# Patient Record
Sex: Male | Born: 1985
Health system: Southern US, Community
[De-identification: ages and names within clinical notes are randomized; demographics above are authoritative.]

## PROBLEM LIST (undated history)

## (undated) DIAGNOSIS — I10 Essential (primary) hypertension: Secondary | ICD-10-CM

## (undated) DIAGNOSIS — E119 Type 2 diabetes mellitus without complications: Secondary | ICD-10-CM

---

## 2011-12-06 ENCOUNTER — Encounter (HOSPITAL_BASED_OUTPATIENT_CLINIC_OR_DEPARTMENT_OTHER): Payer: Self-pay | Admitting: Family Medicine

## 2011-12-06 ENCOUNTER — Emergency Department (HOSPITAL_BASED_OUTPATIENT_CLINIC_OR_DEPARTMENT_OTHER)
Admission: EM | Admit: 2011-12-06 | Discharge: 2011-12-06 | Disposition: A | Discharge status: Home / Self Care | Payer: No Typology Code available for payment source | Attending: Emergency Medicine | Admitting: Emergency Medicine

## 2011-12-06 ENCOUNTER — Emergency Department (HOSPITAL_BASED_OUTPATIENT_CLINIC_OR_DEPARTMENT_OTHER): Payer: No Typology Code available for payment source

## 2011-12-06 DIAGNOSIS — R109 Unspecified abdominal pain: Secondary | ICD-10-CM

## 2011-12-06 DIAGNOSIS — I10 Essential (primary) hypertension: Secondary | ICD-10-CM | POA: Insufficient documentation

## 2011-12-06 DIAGNOSIS — S0993XA Unspecified injury of face, initial encounter: Secondary | ICD-10-CM | POA: Insufficient documentation

## 2011-12-06 DIAGNOSIS — Y93I9 Activity, other involving external motion: Secondary | ICD-10-CM | POA: Insufficient documentation

## 2011-12-06 DIAGNOSIS — Y92410 Unspecified street and highway as the place of occurrence of the external cause: Secondary | ICD-10-CM | POA: Insufficient documentation

## 2011-12-06 DIAGNOSIS — S199XXA Unspecified injury of neck, initial encounter: Secondary | ICD-10-CM | POA: Insufficient documentation

## 2011-12-06 DIAGNOSIS — R1084 Generalized abdominal pain: Secondary | ICD-10-CM | POA: Insufficient documentation

## 2011-12-06 DIAGNOSIS — M542 Cervicalgia: Secondary | ICD-10-CM

## 2011-12-06 HISTORY — DX: Essential (primary) hypertension: I10

## 2011-12-06 MED ORDER — HYDROCODONE-ACETAMINOPHEN 5-325 MG PO TABS: 2.0000 | ORAL_TABLET | ORAL | Status: DC | PRN

## 2011-12-06 MED ORDER — IOHEXOL 300 MG/ML  SOLN
100.0000 mL | Freq: Once | INTRAMUSCULAR | Status: AC | PRN
  Administered 2011-12-06: 100 mL via INTRAVENOUS

## 2011-12-06 NOTE — ED Provider Notes (Signed)
Medical screening examination/treatment/procedure(s) were performed by non-physician practitioner and as supervising physician I was immediately available for consultation/collaboration.   Carleene Cooper III, MD 12/06/11 [phone]
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
## 2016-02-02 ENCOUNTER — Emergency Department (HOSPITAL_BASED_OUTPATIENT_CLINIC_OR_DEPARTMENT_OTHER)
Admission: EM | Admit: 2016-02-02 | Discharge: 2016-02-02 | Disposition: A | Discharge status: Home / Self Care | Payer: Self-pay | Attending: Emergency Medicine | Admitting: Emergency Medicine

## 2016-02-02 ENCOUNTER — Encounter (HOSPITAL_BASED_OUTPATIENT_CLINIC_OR_DEPARTMENT_OTHER): Payer: Self-pay | Admitting: Emergency Medicine

## 2016-02-02 DIAGNOSIS — Z79899 Other long term (current) drug therapy: Secondary | ICD-10-CM | POA: Insufficient documentation

## 2016-02-02 DIAGNOSIS — E119 Type 2 diabetes mellitus without complications: Secondary | ICD-10-CM | POA: Insufficient documentation

## 2016-02-02 DIAGNOSIS — J111 Influenza due to unidentified influenza virus with other respiratory manifestations: Secondary | ICD-10-CM

## 2016-02-02 DIAGNOSIS — J0100 Acute maxillary sinusitis, unspecified: Secondary | ICD-10-CM | POA: Insufficient documentation

## 2016-02-02 DIAGNOSIS — R69 Illness, unspecified: Secondary | ICD-10-CM

## 2016-02-02 DIAGNOSIS — Z794 Long term (current) use of insulin: Secondary | ICD-10-CM | POA: Insufficient documentation

## 2016-02-02 DIAGNOSIS — I10 Essential (primary) hypertension: Secondary | ICD-10-CM | POA: Insufficient documentation

## 2016-02-02 HISTORY — DX: Type 2 diabetes mellitus without complications: E11.9

## 2016-02-02 MED ORDER — AMOXICILLIN 500 MG PO CAPS: 500.0000 mg | ORAL_CAPSULE | Freq: Three times a day (TID) | ORAL | 0 refills | Status: DC

## 2016-02-02 MED FILL — AMOXICILLIN 500 MG CAPSULE: 500 | 7 days supply | Qty: 21 | Fill #0

## 2016-02-02 NOTE — ED Triage Notes (Signed)
Pt states he is having facial pain, nasal congestion, cough and headache for over one week.   Unknown fever.  Coughing up yellow sputum.

## 2016-02-02 NOTE — ED Provider Notes (Addendum)
[facility]   CSN: [id number] Arrival date & time: 02/02/16  0715     History   Chief Complaint Chief Complaint  Patient presents with  . URI    HPI Curtis Rogers is a 31 y.o. male.  Patient c/o sinus congestion, drainage and pain in the past week.  He has tried otc meds without relief.  Patient states in past week also had sore throat, non prod cough, body aches, and subjective fever. No definite known ill contacts. Hx dm. No nausea or vomiting. No polyuria.     URI   Associated symptoms include congestion and sinus pain. Pertinent negatives include no chest pain, no diarrhea, no vomiting and no neck pain.    Past Medical History:  Diagnosis Date  . Diabetes mellitus without complication (HCC)   . Hypertension     There are no active problems to display for this patient.   No past surgical history on file.     Home Medications    Prior to Admission medications   Medication Sig Start Date End Date Taking? Authorizing Provider  insulin aspart protamine- aspart (NOVOLOG MIX 70/30) (70-30) 100 UNIT/ML injection Inject 93 Units into the skin 3 (three) times daily.   Yes Historical Provider, MD  lisinopril (PRINIVIL,ZESTRIL) 40 MG tablet Take 10 mg by mouth daily.    Yes Historical Provider, MD  simvastatin (ZOCOR) 40 MG tablet Take 40 mg by mouth daily.   Yes Historical Provider, MD    Family History No family history on file.  Social History Social History  Substance Use Topics  . Smoking status: Never Smoker  . Smokeless tobacco: Never Used  . Alcohol use No     Allergies   Patient has no known allergies.   Review of Systems Review of Systems  Constitutional: Positive for fever.  HENT: Positive for congestion, sinus pain and sinus pressure. Negative for trouble swallowing.   Respiratory: Negative for shortness of breath.   Cardiovascular: Negative for chest pain.  Gastrointestinal: Negative for diarrhea and vomiting.    Endocrine: Negative for polydipsia and polyuria.  Musculoskeletal: Negative for neck pain and neck stiffness.     Physical Exam Updated Vital Signs BP 159/95   Pulse 109   Temp 98.1 F (36.7 C) (Oral)   Resp 18   Ht 6\' 2"  (1.88 m)   Wt (!) 173.8 kg   SpO2 96%   BMI 49.20 kg/m   Physical Exam  Constitutional: He appears well-developed and well-nourished. No distress.  HENT:  Right Ear: External ear normal.  Left Ear: External ear normal.  Mouth/Throat: Oropharynx is clear and moist. No oropharyngeal exudate.  +nasal congestion, +sinus pain/tenderness.   Eyes: Conjunctivae are normal.  Neck: Neck supple. No tracheal deviation present.  No stiffness or rigidity  Cardiovascular: Normal rate, regular rhythm, normal heart sounds and intact distal pulses.  Exam reveals no gallop and no friction rub.   No murmur heard. Pulmonary/Chest: Effort normal and breath sounds normal. No accessory muscle usage. No respiratory distress.  Abdominal: Soft. He exhibits no distension. There is no tenderness.  Musculoskeletal: He exhibits no edema.  Neurological: He is alert.  Skin: Skin is warm and dry. No rash noted. He is not diaphoretic.  Psychiatric: He has a normal mood and affect.  Nursing note and vitals reviewed.    ED Treatments / Results  Labs (all labs ordered are listed, but only abnormal results are displayed) Labs Reviewed - No data to display  EKG  EKG Interpretation None       Radiology No results found.  Procedures Procedures (including critical care time)  Medications Ordered in ED Medications - No data to display   Initial Impression / Assessment and Plan / ED Course  I have reviewed the triage vital signs and the nursing notes.  Pertinent labs & imaging results that were available during my care of the patient were reviewed by me and considered in my medical decision making (see chart for details).  Recent ILI, now with sinus pain/pressure/drainage  not improved w otc meds.  Will rx sinusitis. Confirmed nkda w pt.     Final Clinical Impressions(s) / ED Diagnoses   Final diagnoses:  None    New Prescriptions New Prescriptions   No medications on file         Cathren Laine, MD 02/02/16 [id number]

## 2016-02-02 NOTE — Discharge Instructions (Signed)
It was our pleasure to provide your ER care today - we hope that you feel better.  Rest. Drink plenty of fluids.  Take tylenol and motrin as need for fever.  You may try an over the counter sinus medication as need for symptom relief.  Take amoxicillin (antibiotic) as prescribed.   Follow up with primary care doctor in 1 week if symptoms fail to improve/resolve.  Your blood pressure is high today - continue your medication, and follow up with your doctor in the next couple weeks.  Return to ER if worse, trouble breathing, other concern.

## 2016-12-16 ENCOUNTER — Other Ambulatory Visit: Payer: Self-pay

## 2016-12-16 ENCOUNTER — Emergency Department (HOSPITAL_BASED_OUTPATIENT_CLINIC_OR_DEPARTMENT_OTHER)
Admission: EM | Admit: 2016-12-16 | Discharge: 2016-12-16 | Disposition: A | Discharge status: Home / Self Care | Payer: Self-pay | Attending: Emergency Medicine | Admitting: Emergency Medicine

## 2016-12-16 ENCOUNTER — Encounter (HOSPITAL_BASED_OUTPATIENT_CLINIC_OR_DEPARTMENT_OTHER): Payer: Self-pay | Admitting: *Deleted

## 2016-12-16 DIAGNOSIS — H9209 Otalgia, unspecified ear: Secondary | ICD-10-CM | POA: Insufficient documentation

## 2016-12-16 DIAGNOSIS — I10 Essential (primary) hypertension: Secondary | ICD-10-CM | POA: Insufficient documentation

## 2016-12-16 DIAGNOSIS — E119 Type 2 diabetes mellitus without complications: Secondary | ICD-10-CM | POA: Insufficient documentation

## 2016-12-16 DIAGNOSIS — J019 Acute sinusitis, unspecified: Secondary | ICD-10-CM | POA: Insufficient documentation

## 2016-12-16 DIAGNOSIS — J069 Acute upper respiratory infection, unspecified: Secondary | ICD-10-CM | POA: Insufficient documentation

## 2016-12-16 DIAGNOSIS — R51 Headache: Secondary | ICD-10-CM | POA: Insufficient documentation

## 2016-12-16 DIAGNOSIS — Z794 Long term (current) use of insulin: Secondary | ICD-10-CM | POA: Insufficient documentation

## 2016-12-16 LAB — RAPID STREP SCREEN (MED CTR MEBANE ONLY): Streptococcus, Group A Screen (Direct): NEGATIVE

## 2016-12-16 MED ORDER — AZITHROMYCIN 250 MG PO TABS: 250.0000 mg | ORAL_TABLET | Freq: Every day | ORAL | 0 refills | Status: DC

## 2016-12-16 NOTE — ED Provider Notes (Signed)
[facility] EMERGENCY DEPARTMENT Provider Note   CSN: [id number] Arrival date & time: 12/16/16  1415     History   Chief Complaint Chief Complaint  Patient presents with  . Sore Throat    HPI Curtis Rogers is a 31 y.o. male.  The history is provided by the patient.  URI   This is a new problem. Episode onset: 4 days ago. The problem has been gradually worsening. There has been no fever. Associated symptoms include congestion, ear pain, headaches, plugged ear sensation, rhinorrhea, sinus pain, sore throat and cough. Pertinent negatives include no chest pain, no abdominal pain, no diarrhea, no nausea, no vomiting, no neck pain and no wheezing. Treatments tried: mucinex. The treatment provided no relief.    Past Medical History:  Diagnosis Date  . Diabetes mellitus without complication (HCC)   . Hypertension     There are no active problems to display for this patient.   History reviewed. No pertinent surgical history.     Home Medications    Prior to Admission medications   Medication Sig Start Date End Date Taking? Authorizing Provider  atorvastatin (LIPITOR) 40 MG tablet Take 40 mg by mouth daily.   Yes [provider]  lovastatin (ALTOPREV) 20 MG 24 hr tablet Take 20 mg by mouth at bedtime.   Yes [provider]  azithromycin (ZITHROMAX) 250 MG tablet Take 1 tablet (250 mg total) by mouth daily. Take first 2 tablets together, then 1 every day until finished.  Start in 2 days if still having symptoms 12/16/16   Gwyneth SproutPlunkett, Panayiotis [place], MD  insulin aspart protamine- aspart (NOVOLOG MIX 70/30) (70-30) 100 UNIT/ML injection Inject 93 Units into the skin 3 (three) times daily.    [provider]  lisinopril (PRINIVIL,ZESTRIL) 40 MG tablet Take 10 mg by mouth daily.     [provider]    Family History History reviewed. No pertinent family history.  Social History Social History   Tobacco Use  . Smoking status: Never  Smoker  . Smokeless tobacco: Never Used  Substance Use Topics  . Alcohol use: No  . Drug use: No     Allergies   Patient has no known allergies.   Review of Systems Review of Systems  HENT: Positive for congestion, ear pain, rhinorrhea, sinus pain and sore throat.   Respiratory: Positive for cough. Negative for wheezing.   Cardiovascular: Negative for chest pain.  Gastrointestinal: Negative for abdominal pain, diarrhea, nausea and vomiting.  Musculoskeletal: Negative for neck pain.  Neurological: Positive for headaches.  All other systems reviewed and are negative.    Physical Exam Updated Vital Signs BP (!) 163/94 (BP Location: Right Arm)   Pulse (!) 122   Temp 99 F (37.2 C) (Oral)   Resp 20   Ht 6\' 2"  (1.88 m)   Wt (!) 164.2 kg (362 lb)   SpO2 100%   BMI 46.48 kg/m   Physical Exam  Constitutional: He is oriented to person, place, and time. He appears well-developed and well-nourished. No distress.  HENT:  Head: Normocephalic and atraumatic.  Right Ear: Tympanic membrane is not erythematous, not retracted and not bulging. A middle ear effusion is present.  Left Ear: Tympanic membrane normal. Tympanic membrane is not erythematous, not retracted and not bulging.  Nose: Mucosal edema and rhinorrhea present. Right sinus exhibits maxillary sinus tenderness and frontal sinus tenderness. Left sinus exhibits maxillary sinus tenderness and frontal sinus tenderness.  Mouth/Throat: Mucous membranes are normal. No oral lesions.  Posterior oropharyngeal erythema present. No oropharyngeal exudate or posterior oropharyngeal edema.  Eyes: Conjunctivae and EOM are normal. Pupils are equal, round, and reactive to light.  Neck: Normal range of motion. Neck supple.  Cardiovascular: Normal rate, regular rhythm and intact distal pulses.  No murmur heard. Pulmonary/Chest: Effort normal and breath sounds normal. No respiratory distress. He has no wheezes. He has no rales.  Abdominal: Soft.  He exhibits no distension. There is no tenderness. There is no rebound and no guarding.  Musculoskeletal: Normal range of motion. He exhibits no edema or tenderness.  Neurological: He is alert and oriented to person, place, and time.  Skin: Skin is warm and dry. No rash noted. No erythema.  Psychiatric: He has a normal mood and affect. His behavior is normal.  Nursing note and vitals reviewed.    ED Treatments / Results  Labs (all labs ordered are listed, but only abnormal results are displayed) Labs Reviewed  RAPID STREP SCREEN (NOT AT [facility])  CULTURE, GROUP A STREP [facility])    EKG  EKG Interpretation None       Radiology No results found.  Procedures Procedures (including critical care time)  Medications Ordered in ED Medications - No data to display   Initial Impression / Assessment and Plan / ED Course  I have reviewed the triage vital signs and the nursing notes.  Pertinent labs & imaging results that were available during my care of the patient were reviewed by me and considered in my medical decision making (see chart for details).    Pt with symptoms consistent with viral URI and sinusitis.  Well appearing here.  No signs of breathing difficulty  No signs of pharyngitis, otitis or abnormal abdominal findings.   strep wnl and pt to return with any further problems.  Discussed holding antibiotics for several days but if symptoms persist then he can take them.   Final Clinical Impressions(s) / ED Diagnoses   Final diagnoses:  Viral upper respiratory tract infection  Acute non-recurrent sinusitis, unspecified location    ED Discharge Orders        Ordered    azithromycin (ZITHROMAX) 250 MG tablet  Daily     12/16/16 1518       Gwyneth SproutPlunkett, Areatha Kalata, MD 12/16/16 1524

## 2016-12-16 NOTE — ED Triage Notes (Signed)
Pt reports sore throat x 4 days. Unknown fever. Denies OTC meds PTA

## 2016-12-20 LAB — CULTURE, GROUP A STREP (THRC)

## 2017-02-23 ENCOUNTER — Encounter (HOSPITAL_BASED_OUTPATIENT_CLINIC_OR_DEPARTMENT_OTHER): Payer: Self-pay | Admitting: *Deleted

## 2017-02-23 ENCOUNTER — Emergency Department (HOSPITAL_BASED_OUTPATIENT_CLINIC_OR_DEPARTMENT_OTHER): Payer: Self-pay

## 2017-02-23 ENCOUNTER — Other Ambulatory Visit: Payer: Self-pay

## 2017-02-23 ENCOUNTER — Inpatient Hospital Stay (HOSPITAL_BASED_OUTPATIENT_CLINIC_OR_DEPARTMENT_OTHER)
Admission: EM | Admit: 2017-02-23 | Discharge: 2017-02-26 | DRG: 872 | Disposition: A | Discharge status: Home / Self Care | Payer: Self-pay | Attending: Internal Medicine | Admitting: Internal Medicine

## 2017-02-23 DIAGNOSIS — Z6841 Body Mass Index (BMI) 40.0 and over, adult: Secondary | ICD-10-CM

## 2017-02-23 DIAGNOSIS — E876 Hypokalemia: Secondary | ICD-10-CM | POA: Diagnosis present

## 2017-02-23 DIAGNOSIS — E872 Acidosis: Secondary | ICD-10-CM | POA: Diagnosis present

## 2017-02-23 DIAGNOSIS — I129 Hypertensive chronic kidney disease with stage 1 through stage 4 chronic kidney disease, or unspecified chronic kidney disease: Secondary | ICD-10-CM | POA: Diagnosis present

## 2017-02-23 DIAGNOSIS — E871 Hypo-osmolality and hyponatremia: Secondary | ICD-10-CM | POA: Diagnosis present

## 2017-02-23 DIAGNOSIS — A419 Sepsis, unspecified organism: Principal | ICD-10-CM | POA: Diagnosis present

## 2017-02-23 DIAGNOSIS — N182 Chronic kidney disease, stage 2 (mild): Secondary | ICD-10-CM | POA: Diagnosis present

## 2017-02-23 DIAGNOSIS — E7800 Pure hypercholesterolemia, unspecified: Secondary | ICD-10-CM | POA: Diagnosis present

## 2017-02-23 DIAGNOSIS — L03811 Cellulitis of head [any part, except face]: Secondary | ICD-10-CM | POA: Diagnosis present

## 2017-02-23 DIAGNOSIS — Z794 Long term (current) use of insulin: Secondary | ICD-10-CM

## 2017-02-23 DIAGNOSIS — L0291 Cutaneous abscess, unspecified: Secondary | ICD-10-CM

## 2017-02-23 DIAGNOSIS — E1122 Type 2 diabetes mellitus with diabetic chronic kidney disease: Secondary | ICD-10-CM | POA: Diagnosis present

## 2017-02-23 DIAGNOSIS — L02811 Cutaneous abscess of head [any part, except face]: Secondary | ICD-10-CM | POA: Diagnosis present

## 2017-02-23 DIAGNOSIS — I10 Essential (primary) hypertension: Secondary | ICD-10-CM | POA: Diagnosis present

## 2017-02-23 DIAGNOSIS — E118 Type 2 diabetes mellitus with unspecified complications: Secondary | ICD-10-CM | POA: Diagnosis present

## 2017-02-23 DIAGNOSIS — M6008 Infective myositis, other site: Secondary | ICD-10-CM | POA: Diagnosis present

## 2017-02-23 LAB — COMPREHENSIVE METABOLIC PANEL
ALT: 17 U/L (ref 17–63)
AST: 19 U/L (ref 15–41)
Albumin: 4.4 g/dL (ref 3.5–5.0)
Alkaline Phosphatase: 124 U/L (ref 38–126)
Anion gap: 13 (ref 5–15)
BUN: 13 mg/dL (ref 6–20)
CO2: 25 mmol/L (ref 22–32)
Calcium: 10.4 mg/dL — ABNORMAL HIGH (ref 8.9–10.3)
Chloride: 94 mmol/L — ABNORMAL LOW (ref 101–111)
Creatinine, Ser: 0.93 mg/dL (ref 0.61–1.24)
GFR calc Af Amer: >60 mL/min (ref >60)
GFR calc non Af Amer: >60 mL/min (ref >60)
Glucose, Bld: 355 mg/dL — ABNORMAL HIGH (ref 65–99)
Potassium: 3.4 mmol/L — ABNORMAL LOW (ref 3.5–5.1)
Sodium: 132 mmol/L — ABNORMAL LOW (ref 135–145)
Total Bilirubin: 1.2 mg/dL (ref 0.3–1.2)
Total Protein: 8.8 g/dL — ABNORMAL HIGH (ref 6.5–8.1)

## 2017-02-23 LAB — CBC WITH DIFFERENTIAL/PLATELET
Basophils Absolute: 0 10*3/uL (ref 0.0–0.1)
Basophils Relative: 0 %
Eosinophils Absolute: 0.1 10*3/uL (ref 0.0–0.7)
Eosinophils Relative: 1 %
HCT: 43.8 % (ref 39.0–52.0)
Hemoglobin: 15.8 g/dL (ref 13.0–17.0)
Lymphocytes Relative: 25 %
Lymphs Abs: 3 10*3/uL (ref 0.7–4.0)
MCH: 27.2 pg (ref 26.0–34.0)
MCHC: 36.1 g/dL — ABNORMAL HIGH (ref 30.0–36.0)
MCV: 75.5 fL — ABNORMAL LOW (ref 78.0–100.0)
Monocytes Absolute: 0.7 10*3/uL (ref 0.1–1.0)
Monocytes Relative: 6 %
Neutro Abs: 8.1 10*3/uL — ABNORMAL HIGH (ref 1.7–7.7)
Neutrophils Relative %: 68 %
Platelets: 297 10*3/uL (ref 150–400)
RBC: 5.8 MIL/uL (ref 4.22–5.81)
RDW: 12.5 % (ref 11.5–15.5)
WBC: 11.9 10*3/uL — ABNORMAL HIGH (ref 4.0–10.5)

## 2017-02-23 LAB — CBG MONITORING, ED: Glucose-Capillary: 215 mg/dL — ABNORMAL HIGH (ref 65–99)

## 2017-02-23 LAB — MAGNESIUM: Magnesium: 1.6 mg/dL — ABNORMAL LOW (ref 1.7–2.4)

## 2017-02-23 LAB — PROCALCITONIN: Procalcitonin: <0.1 ng/mL

## 2017-02-23 LAB — I-STAT CG4 LACTIC ACID, ED
Lactic Acid, Venous: 2.54 mmol/L (ref 0.5–1.9)
Lactic Acid, Venous: 1.94 mmol/L — ABNORMAL HIGH (ref 0.5–1.9)

## 2017-02-23 LAB — GLUCOSE, CAPILLARY: Glucose-Capillary: 228 mg/dL — ABNORMAL HIGH (ref 65–99)

## 2017-02-23 LAB — LACTIC ACID, PLASMA: Lactic Acid, Venous: 1.4 mmol/L (ref 0.5–1.9)

## 2017-02-23 MED ORDER — ACETAMINOPHEN 325 MG PO TABS: 650.0000 mg | ORAL_TABLET | Freq: Four times a day (QID) | ORAL | Status: DC | PRN

## 2017-02-23 MED ORDER — OXYCODONE HCL 5 MG PO TABS
5.0000 mg | ORAL_TABLET | ORAL | Status: DC | PRN
  Administered 2017-02-25: 5 mg via ORAL
  Filled 2017-02-23: qty 1

## 2017-02-23 MED ORDER — INSULIN ASPART PROT & ASPART (70-30 MIX) 100 UNIT/ML ~~LOC~~ SUSP
93.0000 [IU] | Freq: Three times a day (TID) | SUBCUTANEOUS | Status: DC
  Filled 2017-02-23: qty 10

## 2017-02-23 MED ORDER — VANCOMYCIN HCL 500 MG IV SOLR
INTRAVENOUS | Status: AC
  Filled 2017-02-23: qty 2000

## 2017-02-23 MED ORDER — PIPERACILLIN-TAZOBACTAM 3.375 G IVPB 30 MIN
3.3750 g | Freq: Once | INTRAVENOUS | Status: AC
  Administered 2017-02-23: 3.375 g via INTRAVENOUS
  Filled 2017-02-23 (×2): qty 50

## 2017-02-23 MED ORDER — LISINOPRIL 10 MG PO TABS: 10.0000 mg | ORAL_TABLET | Freq: Every day | ORAL | Status: DC

## 2017-02-23 MED ORDER — PIPERACILLIN-TAZOBACTAM 3.375 G IVPB
3.3750 g | Freq: Three times a day (TID) | INTRAVENOUS | Status: DC
  Administered 2017-02-23 – 2017-02-25 (×6): 3.375 g via INTRAVENOUS
  Filled 2017-02-23 (×5): qty 50

## 2017-02-23 MED ORDER — INSULIN ASPART 100 UNIT/ML ~~LOC~~ SOLN
0.0000 [IU] | Freq: Every day | SUBCUTANEOUS | Status: DC
  Administered 2017-02-23: 2 [IU] via SUBCUTANEOUS
  Administered 2017-02-24: 4 [IU] via SUBCUTANEOUS
  Administered 2017-02-25: 5 [IU] via SUBCUTANEOUS

## 2017-02-23 MED ORDER — ATORVASTATIN CALCIUM 40 MG PO TABS: 40.0000 mg | ORAL_TABLET | Freq: Every day | ORAL | Status: DC

## 2017-02-23 MED ORDER — SODIUM CHLORIDE 0.9 % IV BOLUS (SEPSIS)
1000.0000 mL | Freq: Once | INTRAVENOUS | Status: AC
  Administered 2017-02-23: 1000 mL via INTRAVENOUS

## 2017-02-23 MED ORDER — LIDOCAINE-EPINEPHRINE (PF) 2 %-1:200000 IJ SOLN
10.0000 mL | Freq: Once | INTRAMUSCULAR | Status: AC
  Administered 2017-02-23: 10 mL via INTRADERMAL
  Filled 2017-02-23: qty 10

## 2017-02-23 MED ORDER — ACETAMINOPHEN 650 MG RE SUPP: 650.0000 mg | Freq: Four times a day (QID) | RECTAL | Status: DC | PRN

## 2017-02-23 MED ORDER — ONDANSETRON HCL 4 MG PO TABS: 4.0000 mg | ORAL_TABLET | Freq: Four times a day (QID) | ORAL | Status: DC | PRN

## 2017-02-23 MED ORDER — INSULIN ASPART 100 UNIT/ML ~~LOC~~ SOLN
0.0000 [IU] | Freq: Three times a day (TID) | SUBCUTANEOUS | Status: DC
  Administered 2017-02-24 (×2): 8 [IU] via SUBCUTANEOUS
  Administered 2017-02-24: 3 [IU] via SUBCUTANEOUS
  Administered 2017-02-25 (×3): 5 [IU] via SUBCUTANEOUS
  Administered 2017-02-26: 11 [IU] via SUBCUTANEOUS

## 2017-02-23 MED ORDER — VANCOMYCIN HCL 10 G IV SOLR
1500.0000 mg | Freq: Three times a day (TID) | INTRAVENOUS | Status: DC
  Administered 2017-02-24 – 2017-02-26 (×8): 1500 mg via INTRAVENOUS
  Filled 2017-02-23 (×9): qty 1500

## 2017-02-23 MED ORDER — PRAVASTATIN SODIUM 20 MG PO TABS
20.0000 mg | ORAL_TABLET | Freq: Every day | ORAL | Status: DC
  Administered 2017-02-24 – 2017-02-25 (×2): 20 mg via ORAL
  Filled 2017-02-23 (×2): qty 1

## 2017-02-23 MED ORDER — VANCOMYCIN HCL 10 G IV SOLR
2000.0000 mg | Freq: Once | INTRAVENOUS | Status: AC
  Administered 2017-02-23: 2000 mg via INTRAVENOUS
  Filled 2017-02-23: qty 2000

## 2017-02-23 MED ORDER — ONDANSETRON HCL 4 MG/2ML IJ SOLN: 4.0000 mg | Freq: Four times a day (QID) | INTRAMUSCULAR | Status: DC | PRN

## 2017-02-23 MED ORDER — IOPAMIDOL (ISOVUE-300) INJECTION 61%
100.0000 mL | Freq: Once | INTRAVENOUS | Status: AC | PRN
  Administered 2017-02-23: 80 mL via INTRAVENOUS

## 2017-02-23 MED ORDER — POTASSIUM CHLORIDE CRYS ER 20 MEQ PO TBCR
20.0000 meq | EXTENDED_RELEASE_TABLET | Freq: Once | ORAL | Status: AC
  Administered 2017-02-23: 20 meq via ORAL
  Filled 2017-02-23: qty 1

## 2017-02-23 MED ORDER — AMLODIPINE BESYLATE 10 MG PO TABS
10.0000 mg | ORAL_TABLET | Freq: Every day | ORAL | Status: DC
  Administered 2017-02-24 – 2017-02-26 (×3): 10 mg via ORAL
  Filled 2017-02-23 (×3): qty 1

## 2017-02-23 MED ORDER — KETOROLAC TROMETHAMINE 30 MG/ML IJ SOLN
30.0000 mg | Freq: Four times a day (QID) | INTRAMUSCULAR | Status: DC | PRN
  Administered 2017-02-23 – 2017-02-26 (×10): 30 mg via INTRAVENOUS
  Filled 2017-02-23 (×10): qty 1

## 2017-02-23 MED ORDER — MORPHINE SULFATE (PF) 4 MG/ML IV SOLN
4.0000 mg | Freq: Once | INTRAVENOUS | Status: AC
  Administered 2017-02-23: 4 mg via INTRAVENOUS
  Filled 2017-02-23: qty 1

## 2017-02-23 MED ORDER — SODIUM CHLORIDE 0.9 % IV SOLN
INTRAVENOUS | Status: AC
  Administered 2017-02-23: 22:00:00 via INTRAVENOUS

## 2017-02-23 MED ORDER — INSULIN GLARGINE 100 UNIT/ML ~~LOC~~ SOLN
90.0000 [IU] | Freq: Every day | SUBCUTANEOUS | Status: DC
  Administered 2017-02-24 – 2017-02-25 (×3): 90 [IU] via SUBCUTANEOUS
  Filled 2017-02-23 (×3): qty 0.9

## 2017-02-23 MED ORDER — LISINOPRIL 20 MG PO TABS
40.0000 mg | ORAL_TABLET | Freq: Every day | ORAL | Status: DC
  Administered 2017-02-24 – 2017-02-26 (×3): 40 mg via ORAL
  Filled 2017-02-23 (×3): qty 2

## 2017-02-23 NOTE — ED Notes (Signed)
Report given to [facility], ETA 20min 

## 2017-02-23 NOTE — ED Notes (Signed)
Patient transported to CT 

## 2017-02-23 NOTE — ED Notes (Signed)
Report called to Immunologistachael RN at [facility]

## 2017-02-23 NOTE — Progress Notes (Addendum)
Pharmacy Antibiotic Note  Audie BoxSteveale Niess is a 32 y.o. male admitted on 02/23/2017 with scalp abscess.  Pharmacy has been consulted for vancomycin dosing. Renal function wnl.  Vancomycin trough goal 10-15  Plan: Start Vancomycin 2g IV x 1 then 1.5g IV q8 Start Zosyn 3.375 gm IV q8h (4 hour infusion) Follow renal function, culture, LOT, level if needed  Height: 6\' 2"  (188 cm) Weight: (!) 362 lb (164.2 kg) IBW/kg (Calculated) : 82.2  Temp (24hrs), Avg:98.1 F (36.7 C), Min:98.1 F (36.7 C), Max:98.1 F (36.7 C)  Recent Labs  Lab 02/23/17 1410 02/23/17 1427  WBC 11.9*  --   CREATININE 0.93  --   LATICACIDVEN  --  2.54*    Estimated Creatinine Clearance: 187.2 mL/min (by C-G formula based on SCr of 0.93 mg/dL).    No Known Allergies  Antimicrobials this admission: 2/22 Vancomycin >> 2/22 Zosyn >>  Dose adjustments this admission: n/a  Microbiology results: none  Thank you for allowing pharmacy to be a part of this patient's care.  Enzo BiNathan Maha Fischel, PharmD, BCPS Clinical Pharmacist Pager [phone] 02/23/2017 3:42 PM

## 2017-02-23 NOTE — ED Triage Notes (Signed)
Abscess to his scalp. Fever. Hx of diabetes.

## 2017-02-23 NOTE — ED Notes (Signed)
Ct will need to wait on labs for iv contrast, d/t hx of DM

## 2017-02-23 NOTE — ED Notes (Signed)
ED Provider at bedside for I&D for scalp

## 2017-02-23 NOTE — ED Provider Notes (Signed)
[facility] EMERGENCY DEPARTMENT Provider Note   CSN: [id number] Arrival date & time: 02/23/17  1320     History   Chief Complaint Chief Complaint  Patient presents with  . Abscess    HPI Curtis Rogers is a 32 y.o. male.  HPI   32 year old male with a history of diabetes, hypertension presents with concern for scalp abscess.  Reports there has been areas of folliculitis and infection or"hair bumps" for the last 3 weeks.  He had tried doing warm compresses and draining the area, however one area in the right side of the scalp has worsened, and after drainage, will appear to fill back up with pus an hour later.  Over the last 5 days, all of his symptoms has worsened, and the area to his posterior right scalp has become significantly swollen, severely painful, and he has had systemic symptoms develop.  Reports he has had diaphoresis, however is unsure if he has had fevers.  Denies chills.  Reports he has had nausea, but no vomiting.  Reports that he has had headaches as well that are severe, and located not just localized to area of scalp.  His glucose has been well-controlled at home.  Denies numbness, weakness, visual changes.  Reports he does occasionally have lightheadedness when he goes from lying down to sitting up.  Has hx of abscesses before but none this bad.   Past Medical History:  Diagnosis Date  . Diabetes mellitus without complication (HCC)   . Hypertension     There are no active problems to display for this patient.   History reviewed. No pertinent surgical history.     Home Medications    Prior to Admission medications   Medication Sig Start Date End Date Taking? Authorizing Provider  atorvastatin (LIPITOR) 40 MG tablet Take 40 mg by mouth daily.   Yes [provider]  insulin aspart protamine- aspart (NOVOLOG MIX 70/30) (70-30) 100 UNIT/ML injection Inject 93 Units into the skin 3 (three) times daily.   Yes [provider]    lisinopril (PRINIVIL,ZESTRIL) 40 MG tablet Take 10 mg by mouth daily.    Yes [provider]  lovastatin (ALTOPREV) 20 MG 24 hr tablet Take 20 mg by mouth at bedtime.   Yes [provider]  azithromycin (ZITHROMAX) 250 MG tablet Take 1 tablet (250 mg total) by mouth daily. Take first 2 tablets together, then 1 every day until finished.  Start in 2 days if still having symptoms 12/16/16   Gwyneth SproutPlunkett, Whitney, MD    Family History No family history on file.  Social History Social History   Tobacco Use  . Smoking status: Never Smoker  . Smokeless tobacco: Never Used  Substance Use Topics  . Alcohol use: No  . Drug use: No     Allergies   Patient has no known allergies.   Review of Systems Review of Systems  Constitutional: Positive for diaphoresis. Negative for fever.  HENT: Negative for sore throat.   Eyes: Negative for visual disturbance.  Respiratory: Negative for shortness of breath.   Cardiovascular: Negative for chest pain.  Gastrointestinal: Positive for nausea. Negative for abdominal pain and vomiting.  Genitourinary: Negative for difficulty urinating.  Musculoskeletal: Negative for back pain and neck stiffness.  Skin: Positive for rash and wound.  Neurological: Positive for light-headedness and headaches. Negative for syncope, facial asymmetry, weakness and numbness.     Physical Exam Updated Vital Signs BP (!) 158/89   Pulse (!) 112  Temp 98.1 F (36.7 C) (Oral)   Resp 20   Ht 6\' 2"  (1.88 m)   Wt (!) 164.2 kg (362 lb)   SpO2 100%   BMI 46.48 kg/m   Physical Exam  Constitutional: He is oriented to person, place, and time. He appears well-developed and well-nourished. No distress.  HENT:  Head: Normocephalic and atraumatic.  Eyes: Conjunctivae and EOM are normal.  Neck: Normal range of motion.  Cardiovascular: Regular rhythm, normal heart sounds and intact distal pulses. Tachycardia present. Exam reveals no gallop and no friction rub.   No murmur heard. Pulmonary/Chest: Effort normal and breath sounds normal. No respiratory distress. He has no wheezes. He has no rales.  Musculoskeletal: He exhibits no edema.  Neurological: He is alert and oriented to person, place, and time.  Skin: Skin is warm. He is diaphoretic.  Right posterior scalp with 1.5cm area of fluctuance, draining with pressure, surrounding scalp edema with tenderness through parietal scalp on right, posterior neck with folliculitis draining   Nursing note and vitals reviewed.    ED Treatments / Results  Labs (all labs ordered are listed, but only abnormal results are displayed) Labs Reviewed  CBC WITH DIFFERENTIAL/PLATELET - Abnormal; Notable for the following components:      Result Value   WBC 11.9 (*)    MCV 75.5 (*)    MCHC 36.1 (*)    Neutro Abs 8.1 (*)    All other components within normal limits  COMPREHENSIVE METABOLIC PANEL - Abnormal; Notable for the following components:   Sodium 132 (*)    Potassium 3.4 (*)    Chloride 94 (*)    Glucose, Bld 355 (*)    Calcium 10.4 (*)    Total Protein 8.8 (*)    All other components within normal limits  I-STAT CG4 LACTIC ACID, ED - Abnormal; Notable for the following components:   Lactic Acid, Venous 2.54 (*)    All other components within normal limits  I-STAT CG4 LACTIC ACID, ED - Abnormal; Notable for the following components:   Lactic Acid, Venous 1.94 (*)    All other components within normal limits  CULTURE, BLOOD (ROUTINE X 2)  CULTURE, BLOOD (ROUTINE X 2)    EKG  EKG Interpretation None       Radiology Ct Head W Or Wo Contrast  Result Date: 02/23/2017 CLINICAL DATA:  Headache.  Scalp abscess. EXAM: CT HEAD WITHOUT AND WITH CONTRAST TECHNIQUE: Contiguous axial images were obtained from the base of the skull through the vertex without and with intravenous contrast CONTRAST:  80mL ISOVUE-300 IOPAMIDOL (ISOVUE-300) INJECTION 61% COMPARISON:  None. FINDINGS: Brain: No mass lesion,  intraparenchymal hemorrhage or extra-axial collection. No evidence of acute cortical infarct. Brain parenchyma and CSF-containing spaces are normal for age. Vascular: No hyperdense vessel or unexpected calcification. Skull: Normal visualized skull base and calvarium. Sinuses/Orbits: No sinus fluid levels or advanced mucosal thickening. No mastoid effusion. Normal orbits. Other: Within the right posterior scalp soft tissues, there is extensive subcutaneous induration with edema of the underlying musculature. There is no fluid collection or abscess. There are 2 enlarged soft tissue nodules measuring approximately 1 cm each, likely enlarged lymph nodes. IMPRESSION: 1. Normal brain. 2. Right posterior scalp cellulitis and probable underlying myositis. No abscess or drainable fluid collection. Electronically Signed   By: Deatra Robinson M.D.   On: 02/23/2017 15:21    Procedures .Marland KitchenIncision and Drainage Date/Time: 02/23/2017 4:51 PM Performed by: Alvira Monday, MD Authorized by: Alvira Monday, MD  Consent:    Consent obtained:  Verbal   Consent given by:  Patient   Risks discussed:  Bleeding, incomplete drainage, infection, damage to other organs and pain   Alternatives discussed:  No treatment Location:    Type:  Abscess   Size:  1.5   Location:  Head   Head location:  Scalp Pre-procedure details:    Skin preparation:  Chloraprep Procedure details:    Incision types:  Stab incision   Incision depth:  Dermal   Scalpel blade:  11   Wound management:  Probed and deloculated   Drainage:  Purulent   Drainage amount:  Moderate   Wound treatment:  Wound left open   Packing materials:  None Post-procedure details:    Patient tolerance of procedure:  Tolerated well, no immediate complications  .Critical Care Performed by: Alvira Monday, MD Authorized by: Alvira Monday, MD   Critical care provider statement:    Critical care time (minutes):  30   Critical care was necessary to treat  or prevent imminent or life-threatening deterioration of the following conditions:  Sepsis   Critical care was time spent personally by me on the following activities:  Examination of patient, discussions with consultants, ordering and review of laboratory studies, ordering and review of radiographic studies and ordering and performing treatments and interventions   (including critical care time)  Medications Ordered in ED Medications  vancomycin (VANCOCIN) 2,000 mg in sodium chloride 0.9 % 500 mL IVPB (2,000 mg Intravenous New Bag/Given 02/23/17 1557)  vancomycin (VANCOCIN) 1,500 mg in sodium chloride 0.9 % 500 mL IVPB (not administered)  piperacillin-tazobactam (ZOSYN) IVPB 3.375 g (not administered)  vancomycin (VANCOCIN) 500 MG powder (not administered)  lidocaine-EPINEPHrine (XYLOCAINE W/EPI) 2 %-1:200000 (PF) injection 10 mL (10 mLs Intradermal Given 02/23/17 1358)  sodium chloride 0.9 % bolus 1,000 mL (0 mLs Intravenous Stopped 02/23/17 1456)  iopamidol (ISOVUE-300) 61 % injection 100 mL (80 mLs Intravenous Contrast Given 02/23/17 1500)  sodium chloride 0.9 % bolus 1,000 mL (1,000 mLs Intravenous New Bag/Given 02/23/17 1557)  morphine 4 MG/ML injection 4 mg (4 mg Intravenous Given 02/23/17 1557)  piperacillin-tazobactam (ZOSYN) IVPB 3.375 g (3.375 g Intravenous New Bag/Given 02/23/17 1614)     Initial Impression / Assessment and Plan / ED Course  I have reviewed the triage vital signs and the nursing notes.  Pertinent labs & imaging results that were available during my care of the patient were reviewed by me and considered in my medical decision making (see chart for details).     32 year old male with a history of diabetes, hypertension presents with concern for scalp abscess, diaphoresis, nausea, headaches.  Patient was signs of abscess with surrounding scalp edema and tenderness on exam, CT head was done with and without contrast to evaluate for signs of subgaleal abscess or other  deeper infection.  CT shows concern for posterior scalp cellulitis, and probable underlying myositis, with no sign of drainable fluid collection.  On my exam, patient does have an area which was drained at bedside with moderate-copious amount of purulence.  Patient with elevated lactic acid, tachycardia, WBC.  Given 2L of NS. Blood pressures normal. Labs show hyperglycemia without signs of DKA. Lactic acid improved however continuing tachycardia, ordered additional NS.  Given vancomycin and zosyn.  Will admit for IV antibiotics.  Initially called [facility], however no beds available, and patient accepted to [facility].  Made Dr. Lovell Sheehan of [facility] aware of patient, however agree given no sign of deep space abscess on  imaging at this time, plan will be to continue IV abx at [facility], if not improving or concern for clinical change or development of deeper abscess, pt would require transfer to [facility] for further evaluation.     Final Clinical Impressions(s) / ED Diagnoses   Final diagnoses:  Abscess  Cellulitis of scalp  Infective myositis of other site    ED Discharge Orders    None       Alvira Monday, MD 02/23/17 1725

## 2017-02-23 NOTE — H&P (Signed)
Curtis Rogers ZOX:[id number] DOB: 10-17-1985 DOA: 02/23/2017     PCP: [facility] Out[facility] Specialists: None Patient coming from:   home Lives   With family   Chief Complaint: Swelling of the scalp and fever  HPI: Curtis Rogers is a 32 y.o. male with medical history significant of DM2 HTN, hypercholesterolemia, obesity    Presented with 3 weeks of swelling concerning for folliculitis described the patient is "hair bumps" attempted to use warm compresses as well as drainage of the area but only worsened and filled up with pus.  For past 5 days he has had significant worsening in his symptoms with swelling extending to right posterior area of the scalp severely painful to touch.  Developed fevers and diaphoresis nausea but no vomiting.      While in ER: Originally presented to med [employer] sepsis criteria given tachycardia increased respirations and elevated WBC and lactic acid Blood cultures were obtained Abscess was opened and drained by ER provider Patient was started on vancomycin and Zosyn 2 L no saline administered  Significant initial  Findings:  WBC 11.9 Na132 K3.4 Cr 0.93 Potassium 3.4 Glucose 355  lactic acid 2.54>1.94 CT head posterior scalp cellulitis probable underlying myositis but no abscess at this point  ER provider discussed case with: Dr. Lovell Sheehan who recommends: Admission for IV antibiotics if no improvement patient will need to be transferred to [facility] for further evaluation   IN ER:  Temp (24hrs), Avg:98.1 F (36.7 C), Min:98 F (36.7 C), Max:98.2 F (36.8 C)      on arrival  ED Triage Vitals  Enc Vitals Group     BP 02/23/17 1325 (!) 150/109     Pulse Rate 02/23/17 1325 (!) 122     Resp 02/23/17 1325 18     Temp 02/23/17 1325 98.1 F (36.7 C)     Temp Source 02/23/17 1325 Oral     SpO2 02/23/17 1325 99 %     Weight 02/23/17 1324 (!) 362 lb (164.2 kg)     Height 02/23/17 1324 6\' 2"  (1.88 m)     Head  Circumference --      Peak Flow --      Pain Score 02/23/17 1324 10     Pain Loc --      Pain Edu? --      Excl. in GC? --     Latest temperature 98.2 RR 20 setting 100% HR 119 BP 168/105 Following Medications were ordered in ER: Medications  vancomycin (VANCOCIN) 1,500 mg in sodium chloride 0.9 % 500 mL IVPB (not administered)  piperacillin-tazobactam (ZOSYN) IVPB 3.375 g (not administered)  vancomycin (VANCOCIN) 500 MG powder (not administered)  insulin aspart protamine- aspart (NOVOLOG MIX 70/30) injection 93 Units (not administered)  lidocaine-EPINEPHrine (XYLOCAINE W/EPI) 2 %-1:200000 (PF) injection 10 mL (10 mLs Intradermal Given 02/23/17 1358)  sodium chloride 0.9 % bolus 1,000 mL (0 mLs Intravenous Stopped 02/23/17 1456)  iopamidol (ISOVUE-300) 61 % injection 100 mL (80 mLs Intravenous Contrast Given 02/23/17 1500)  vancomycin (VANCOCIN) 2,000 mg in sodium chloride 0.9 % 500 mL IVPB (0 mg Intravenous Stopped 02/23/17 1800)  sodium chloride 0.9 % bolus 1,000 mL (0 mLs Intravenous Stopped 02/23/17 1706)  morphine 4 MG/ML injection 4 mg (4 mg Intravenous Given 02/23/17 1557)  piperacillin-tazobactam (ZOSYN) IVPB 3.375 g (0 g Intravenous Stopped 02/23/17 1703)  sodium chloride 0.9 % bolus 1,000 mL (0 mLs Intravenous Stopped 02/23/17 1827)  morphine 4 MG/ML injection 4 mg (  4 mg Intravenous Given 02/23/17 1750)      Hospitalist was called for admission for scalp cellulitis and sepsis  Regarding pertinent Chronic problems: History of diabetes on insulin 70/30 reported good control blood sugars  history of hypertension on lisinopril   Review of Systems:    Pertinent positives include: Fevers,  headaches, nausea,   Constitutional:  No weight loss, night sweats, , fatigue, weight loss  HEENT:  NoDifficulty swallowing,Tooth/dental problems,Sore throat,  No sneezing, itching, ear ache, nasal congestion, post nasal drip,  Cardio-vascular:  No chest pain, Orthopnea, PND, anasarca,  dizziness, palpitations.no Bilateral lower extremity swelling  GI:  No heartburn, indigestion, abdominal pain, vomiting, diarrhea, change in bowel habits, loss of appetite, melena, blood in stool, hematemesis Resp:  no shortness of breath at rest. No dyspnea on exertion, No excess mucus, no productive cough, No non-productive cough, No coughing up of blood.No change in color of mucus.No wheezing. Skin:  no rash or lesions. No jaundice GU:  no dysuria, change in color of urine, no urgency or frequency. No straining to urinate.  No flank pain.  Musculoskeletal:  No joint pain or no joint swelling. No decreased range of motion. No back pain.  Psych:  No change in mood or affect. No depression or anxiety. No memory loss.  Neuro: no localizing neurological complaints, no tingling, no weakness, no double vision, no gait abnormality, no slurred speech, no confusion  As per HPI otherwise 10 point review of systems negative.   Past Medical History: Past Medical History:  Diagnosis Date  . Diabetes mellitus without complication (HCC)   . Hypertension    History reviewed. No pertinent surgical history.   Social History:  Ambulatory  Independently    reports that  has never smoked. he has never used smokeless tobacco. He reports that he does not drink alcohol or use drugs.  Allergies:  No Known Allergies     Family History:    Family History  Problem Relation Age of Onset  . Stroke Mother   . Diabetes Father   . Diabetes Other   . Breast cancer Other   . CAD Neg Hx     Medications: Prior to Admission medications   Medication Sig Start Date End Date Taking? Authorizing Provider  atorvastatin (LIPITOR) 40 MG tablet Take 40 mg by mouth daily.   Yes [provider]  insulin aspart protamine- aspart (NOVOLOG MIX 70/30) (70-30) 100 UNIT/ML injection Inject 93 Units into the skin 3 (three) times daily.   Yes [provider]  lisinopril (PRINIVIL,ZESTRIL) 40 MG  tablet Take 10 mg by mouth daily.    Yes [provider]  lovastatin (ALTOPREV) 20 MG 24 hr tablet Take 20 mg by mouth at bedtime.   Yes [provider]  azithromycin (ZITHROMAX) 250 MG tablet Take 1 tablet (250 mg total) by mouth daily. Take first 2 tablets together, then 1 every day until finished.  Start in 2 days if still having symptoms 12/16/16   Gwyneth Sprout, MD    Physical Exam: Patient Vitals for the past 24 hrs:  BP Temp Temp src Pulse Resp SpO2 Height Weight  02/23/17 2026 (!) 168/105 98.2 F (36.8 C) Oral (!) 119 20 100 % 6\' 2"  (1.88 m) (!) 178.3 kg (393 lb 1.3 oz)  02/23/17 1922 (!) 162/90 -- -- (!) 109 18 100 % -- --  02/23/17 1900 (!) 154/100 -- -- -- -- -- -- --  02/23/17 1830 (!) 154/98 -- -- -- -- -- -- --  02/23/17 1800 (!) 146/84 -- -- -- 16 99 % -- --  02/23/17 1738 (!) 156/95 -- -- -- -- -- -- --  02/23/17 1732 -- 98 F (36.7 C) Oral -- -- -- -- --  02/23/17 1700 137/68 -- -- (!) 107 (!) 25 97 % -- --  02/23/17 1640 (!) 158/89 -- -- (!) 112 20 100 % -- --  02/23/17 1615 -- -- -- (!) 108 15 99 % -- --  02/23/17 1600 (!) 155/108 -- -- (!) 124 (!) 21 99 % -- --  02/23/17 1530 -- -- -- (!) 106 (!) 23 96 % -- --  02/23/17 1514 (!) 154/99 -- -- -- -- -- -- --  02/23/17 1446 (!) 146/76 -- -- (!) 110 18 98 % -- --  02/23/17 1430 -- -- -- -- 17 -- -- --  02/23/17 1325 (!) 150/109 98.1 F (36.7 C) Oral (!) 122 18 99 % -- --  02/23/17 1324 -- -- -- -- -- -- 6\' 2"  (1.88 m) (!) 164.2 kg (362 lb)    1. General:  in No Acute distress  well  -appearing 2. Psychological: Alert and  Oriented 3. Head/ENT:    Dry Mucous Membranes                          Head Non traumatic, neck supple                          Normal Dentition 4. SKIN:  decreased Skin turgor,  Skin scalp induration noted fluctuance with expressable pus    5. Heart: Regular rate and rhythm no Murmur, no Rub or gallop 6. Lungs:  Clear to auscultation bilaterally, no wheezes or crackles    7. Abdomen: Soft, non-tender, Non distended   obese   8. Lower extremities: no clubbing, cyanosis, or edema 9. Neurologically Grossly intact, moving all 4 extremities equally   10. MSK: Normal range of motion   body mass index is 50.47 kg/m.  Labs on Admission:   Labs on Admission: I have personally reviewed following labs and imaging studies  CBC: Recent Labs  Lab 02/23/17 1410  WBC 11.9*  NEUTROABS 8.1*  HGB 15.8  HCT 43.8  MCV 75.5*  PLT 297   Basic Metabolic Panel: Recent Labs  Lab 02/23/17 1410  NA 132*  K 3.4*  CL 94*  CO2 25  GLUCOSE 355*  BUN 13  CREATININE 0.93  CALCIUM 10.4*   GFR: Estimated Creatinine Clearance: 196.3 mL/min (by C-G formula based on SCr of 0.93 mg/dL). Liver Function Tests: Recent Labs  Lab 02/23/17 1410  AST 19  ALT 17  ALKPHOS 124  BILITOT 1.2  PROT 8.8*  ALBUMIN 4.4   No results for input(s): LIPASE, AMYLASE in the last 168 hours. No results for input(s): AMMONIA in the last 168 hours. Coagulation Profile: No results for input(s): INR, PROTIME in the last 168 hours. Cardiac Enzymes: No results for input(s): CKTOTAL, CKMB, CKMBINDEX, TROPONINI in the last 168 hours. BNP (last 3 results) No results for input(s): PROBNP in the last 8760 hours. HbA1C: No results for input(s): HGBA1C in the last 72 hours. CBG: Recent Labs  Lab 02/23/17 1735  GLUCAP 215*   Lipid Profile: No results for input(s): CHOL, HDL, LDLCALC, TRIG, CHOLHDL, LDLDIRECT in the last 72 hours. Thyroid Function Tests: No results for input(s): TSH, T4TOTAL, FREET4, T3FREE, THYROIDAB in the last 72 hours. Anemia Panel:  No results for input(s): VITAMINB12, FOLATE, FERRITIN, TIBC, IRON, RETICCTPCT in the last 72 hours. Urine analysis: No results found for: COLORURINE, APPEARANCEUR, LABSPEC, PHURINE, GLUCOSEU, HGBUR, BILIRUBINUR, KETONESUR, PROTEINUR, UROBILINOGEN, NITRITE, LEUKOCYTESUR Sepsis Labs: @LABRCNTIP (procalcitonin:4,lacticidven:4) )No results  found for this or any previous visit (from the past 240 hour(s)).    UA not ordered  No results found for: HGBA1C  Estimated Creatinine Clearance: 196.3 mL/min (by C-G formula based on SCr of 0.93 mg/dL).  BNP (last 3 results) No results for input(s): PROBNP in the last 8760 hours.   ECG REPORT Not ordered  [facility] Weights   02/23/17 1324 02/23/17 2026  Weight: (!) 164.2 kg (362 lb) (!) 178.3 kg (393 lb 1.3 oz)     Cultures:    Component Value Date/Time   SDES THROAT 12/16/2016 1433   SPECREQUEST NONE Reflexed from [id number] 12/16/2016 1433   CULT  12/16/2016 1433    NO GROUP A STREP (S.PYOGENES) ISOLATED Performed at [facility], [address] [place], [place] [address]    REPTSTATUS 12/20/2016 FINAL 12/16/2016 1433     Radiological Exams on Admission: Ct Head W Or Wo Contrast  Result Date: 02/23/2017 CLINICAL DATA:  Headache.  Scalp abscess. EXAM: CT HEAD WITHOUT AND WITH CONTRAST TECHNIQUE: Contiguous axial images were obtained from the base of the skull through the vertex without and with intravenous contrast CONTRAST:  80mL ISOVUE-300 IOPAMIDOL (ISOVUE-300) INJECTION 61% COMPARISON:  None. FINDINGS: Brain: No mass lesion, intraparenchymal hemorrhage or extra-axial collection. No evidence of acute cortical infarct. Brain parenchyma and CSF-containing spaces are normal for age. Vascular: No hyperdense vessel or unexpected calcification. Skull: Normal visualized skull base and calvarium. Sinuses/Orbits: No sinus fluid levels or advanced mucosal thickening. No mastoid effusion. Normal orbits. Other: Within the right posterior scalp soft tissues, there is extensive subcutaneous induration with edema of the underlying musculature. There is no fluid collection or abscess. There are 2 enlarged soft tissue nodules measuring approximately 1 cm each, likely enlarged lymph nodes. IMPRESSION: 1. Normal brain. 2. Right posterior scalp cellulitis and probable underlying myositis. No  abscess or drainable fluid collection. Electronically Signed   By: Deatra Robinson M.D.   On: 02/23/2017 15:21    Chart has been reviewed    Assessment/Plan   32 y.o. male with medical history significant of DM2 HTN, hypercholesterolemia, obesity Admitted for cellulitis of the scalp resulting in sepsis  Present on Admission: . Cellulitis of scalp - -admit per cellulitis protocol will        continue current antibiotic choice vancomycin and Zosyn given patient      Imaging showed: no evidence of air  no evidence of osteomyelitis   No  foreign   objects       Will obtain MRSA screening,      blood cultures obtained in the ER     Check HIV status     further antibiotic adjustment pending above results  . Hypokalemia replaced and check magnesium level . Essential hypertension - continue lisinopril and norvasc . DM (diabetes mellitus), type 2 with complications (HCC)   - Order moderate  SSI   -   switch to   Lantus 90 units QHS, will need to resume home regimen at discharge  -  check TSH and HgA1C  - Hold by mouth medications     . Sepsis (HCC) -most likely secondary to cellulitis, continue broad-spectrum antibiotics administered IV fluids, lactic acid trending down, blood cultures pending   Other plan as per orders.  DVT prophylaxis:  SCD  Code Status:  FULL CODE  as per patient   Family Communication:   Family not  at  Bedside  plan of care was discussed with   Brother ,  mother  Disposition Plan:   To home once workup is complete and patient is stable                            Consults called: none    Admission status:  inpatient     Level of care  tele            I have spent a total of 56min on this admission  Racquel Arkin 02/23/2017, 10:19 PM    [facility]  Pager [phone]   after 2 AM please page floor coverage PA If 7AM-7PM, please contact the day team taking care of the patient  [email or web address]  Password [email or web address]

## 2017-02-24 DIAGNOSIS — Z6841 Body Mass Index (BMI) 40.0 and over, adult: Secondary | ICD-10-CM

## 2017-02-24 DIAGNOSIS — A419 Sepsis, unspecified organism: Principal | ICD-10-CM

## 2017-02-24 DIAGNOSIS — E876 Hypokalemia: Secondary | ICD-10-CM

## 2017-02-24 DIAGNOSIS — E119 Type 2 diabetes mellitus without complications: Secondary | ICD-10-CM

## 2017-02-24 DIAGNOSIS — L03811 Cellulitis of head [any part, except face]: Secondary | ICD-10-CM

## 2017-02-24 DIAGNOSIS — Z794 Long term (current) use of insulin: Secondary | ICD-10-CM

## 2017-02-24 LAB — COMPREHENSIVE METABOLIC PANEL
ALT: 14 U/L — ABNORMAL LOW (ref 17–63)
AST: 17 U/L (ref 15–41)
Albumin: 3.6 g/dL (ref 3.5–5.0)
Alkaline Phosphatase: 99 U/L (ref 38–126)
Anion gap: 12 (ref 5–15)
BUN: 12 mg/dL (ref 6–20)
CO2: 22 mmol/L (ref 22–32)
Calcium: 8.8 mg/dL — ABNORMAL LOW (ref 8.9–10.3)
Chloride: 100 mmol/L — ABNORMAL LOW (ref 101–111)
Creatinine, Ser: 1.03 mg/dL (ref 0.61–1.24)
GFR calc Af Amer: >60 mL/min (ref >60)
GFR calc non Af Amer: >60 mL/min (ref >60)
Glucose, Bld: 337 mg/dL — ABNORMAL HIGH (ref 65–99)
Potassium: 3.4 mmol/L — ABNORMAL LOW (ref 3.5–5.1)
Sodium: 134 mmol/L — ABNORMAL LOW (ref 135–145)
Total Bilirubin: 1.4 mg/dL — ABNORMAL HIGH (ref 0.3–1.2)
Total Protein: 7.2 g/dL (ref 6.5–8.1)

## 2017-02-24 LAB — CBC
HCT: 40.1 % (ref 39.0–52.0)
Hemoglobin: 13.9 g/dL (ref 13.0–17.0)
MCH: 27.7 pg (ref 26.0–34.0)
MCHC: 34.7 g/dL (ref 30.0–36.0)
MCV: 80 fL (ref 78.0–100.0)
Platelets: 263 10*3/uL (ref 150–400)
RBC: 5.01 MIL/uL (ref 4.22–5.81)
RDW: 12.5 % (ref 11.5–15.5)
WBC: 12.9 10*3/uL — ABNORMAL HIGH (ref 4.0–10.5)

## 2017-02-24 LAB — MRSA PCR SCREENING: MRSA by PCR: POSITIVE — AB

## 2017-02-24 LAB — HIV ANTIBODY (ROUTINE TESTING W REFLEX): HIV Screen 4th Generation wRfx: NONREACTIVE

## 2017-02-24 LAB — GLUCOSE, CAPILLARY
Glucose-Capillary: 269 mg/dL — ABNORMAL HIGH (ref 65–99)
Glucose-Capillary: 188 mg/dL — ABNORMAL HIGH (ref 65–99)
Glucose-Capillary: 281 mg/dL — ABNORMAL HIGH (ref 65–99)
Glucose-Capillary: 306 mg/dL — ABNORMAL HIGH (ref 65–99)

## 2017-02-24 LAB — CREATININE, URINE, RANDOM: Creatinine, Urine: 128.06 mg/dL

## 2017-02-24 LAB — TSH: TSH: 1.427 u[IU]/mL (ref 0.350–4.500)

## 2017-02-24 LAB — LACTIC ACID, PLASMA: Lactic Acid, Venous: 1.7 mmol/L (ref 0.5–1.9)

## 2017-02-24 LAB — PHOSPHORUS: Phosphorus: 3.4 mg/dL (ref 2.5–4.6)

## 2017-02-24 LAB — MAGNESIUM: Magnesium: 1.6 mg/dL — ABNORMAL LOW (ref 1.7–2.4)

## 2017-02-24 LAB — SODIUM, URINE, RANDOM: Sodium, Ur: 96 mmol/L

## 2017-02-24 MED ORDER — CHLORHEXIDINE GLUCONATE CLOTH 2 % EX PADS
6.0000 | MEDICATED_PAD | Freq: Every day | CUTANEOUS | Status: DC
  Administered 2017-02-25 – 2017-02-26 (×2): 6 via TOPICAL

## 2017-02-24 MED ORDER — POTASSIUM CHLORIDE CRYS ER 20 MEQ PO TBCR
40.0000 meq | EXTENDED_RELEASE_TABLET | Freq: Once | ORAL | Status: AC
Start: 2017-02-24 — End: 2017-02-24
  Administered 2017-02-24: 40 meq via ORAL

## 2017-02-24 MED ORDER — MAGNESIUM SULFATE 2 GM/50ML IV SOLN
2.0000 g | Freq: Once | INTRAVENOUS | Status: AC
  Administered 2017-02-24: 2 g via INTRAVENOUS
  Filled 2017-02-24: qty 50

## 2017-02-24 MED ORDER — MUPIROCIN 2 % EX OINT
1.0000 "application " | TOPICAL_OINTMENT | Freq: Two times a day (BID) | CUTANEOUS | Status: DC
  Administered 2017-02-24 – 2017-02-26 (×6): 1 via NASAL
  Filled 2017-02-24: qty 44

## 2017-02-24 MED ORDER — MAGNESIUM SULFATE 2 GM/50ML IV SOLN: 2.0000 g | Freq: Once | INTRAVENOUS | Status: DC

## 2017-02-24 NOTE — Progress Notes (Signed)
Patient IV fluid order has expired. Clance Boll. Bodenheimer, NP called to re-assess. Verbal order to d/c fluids.

## 2017-02-24 NOTE — Progress Notes (Addendum)
PROGRESS NOTE  Curtis Rogers ONG:[id number] DOB: 12/23/1985 DOA: 02/23/2017 PCP: System, Pcp Not In  HPI/Recap of past 24 hours:  Feeling better, no fever, tachycardia has improved  Scalp cellulitis  spreading down to lateral face on the right side and posterior neck, report overall has improved  He reported new skin blister on right upper anterior thigh  Husband at bedside  Assessment/Plan: Active Problems:   Cellulitis of scalp   Hypokalemia   Essential hypertension   DM (diabetes mellitus), type 2 with complications (HCC)   Sepsis (HCC)   Hyponatremia  Sepsis with scalp cellulitis -Sepsis presented on presentation with sinus tachycardia ,lactic acidosis, leukocytosis -CT head: "Right posterior scalp cellulitis and probable underlying myositis. No abscess or drainable fluid collection." -HIV screening negative -He is started on Vanco and Zosyn in the ED, continue.  Blood culture negative.  MRSA screen positive for MRSA.  Superficial wounds swab culture pending  Hypokalemia/ hypomagnesemia Replace K and mag  Insulin-dependent type 2 diabetes Report blood sugar running from 300-400 at home We will check A1c Adjust insulin Diabetes education  Hypertension: continue home meds Norvasc and lisinopril  Hyperlipidemia: continue statin  CKD 2 renal function stable at baseline.  Morbid obesity Body mass index is 50.47 kg/m.  Code Status: Full  Family Communication: patient and his husband at bedside  Disposition Plan: Not ready to discharge   Consultants:  None  Procedures: bedside for I&D for scalp in the ED on 2/22  Antibiotics:  Vanco and Zosyn since admission   Objective: BP 138/80 (BP Location: Left Arm)   Pulse (!) 107   Temp 98.4 F (36.9 C) (Oral)   Resp 16   Ht 6\' 2"  (1.88 m)   Wt (!) 178.3 kg (393 lb 1.3 oz)   SpO2 92%   BMI 50.47 kg/m   Intake/Output Summary (Last 24 hours) at 02/24/2017 0804 Last data filed at 02/24/2017  0600 Gross per 24 hour  Intake 4901.67 ml  Output 1275 ml  Net 3626.67 ml   Filed Weights   02/23/17 1324 02/23/17 2026  Weight: (!) 164.2 kg (362 lb) (!) 178.3 kg (393 lb 1.3 oz)    Exam: Patient is examined daily including today on 02/24/2017, exams remain the same as of yesterday except that has changed    General:  NAD, scalp cellulitis, induration done to face and neck, see pic below     Cardiovascular: RRR  Respiratory: CTABL  Abdomen: Soft/ND/NT, positive BS  Musculoskeletal: No Edema, right upper thigh blister, no erythema, see pic below  Neuro: alert, oriented     Data Reviewed: Basic Metabolic Panel: Recent Labs  Lab 02/23/17 1410 02/23/17 2124 02/24/17 0530  NA 132*  --  134*  K 3.4*  --  3.4*  CL 94*  --  100*  CO2 25  --  22  GLUCOSE 355*  --  337*  BUN 13  --  12  CREATININE 0.93  --  1.03  CALCIUM 10.4*  --  8.8*  MG  --  1.6* 1.6*  PHOS  --   --  3.4   Liver Function Tests: Recent Labs  Lab 02/23/17 1410 02/24/17 0530  AST 19 17  ALT 17 14*  ALKPHOS 124 99  BILITOT 1.2 1.4*  PROT 8.8* 7.2  ALBUMIN 4.4 3.6   No results for input(s): LIPASE, AMYLASE in the last 168 hours. No results for input(s): AMMONIA in the last 168 hours. CBC: Recent Labs  Lab 02/23/17 1410 02/24/17  0530  WBC 11.9* 12.9*  NEUTROABS 8.1*  --   HGB 15.8 13.9  HCT 43.8 40.1  MCV 75.5* 80.0  PLT 297 263   Cardiac Enzymes:   No results for input(s): CKTOTAL, CKMB, CKMBINDEX, TROPONINI in the last 168 hours. BNP (last 3 results) No results for input(s): BNP in the last 8760 hours.  ProBNP (last 3 results) No results for input(s): PROBNP in the last 8760 hours.  CBG: Recent Labs  Lab 02/23/17 1735 02/23/17 2240 02/24/17 0759  GLUCAP 215* 228* 269*    Recent Results (from the past 240 hour(s))  MRSA PCR Screening     Status: Abnormal   Collection Time: 02/23/17  9:41 PM  Result Value Ref Range Status   MRSA by PCR POSITIVE (A) NEGATIVE Final     Comment:        The GeneXpert MRSA Assay (FDA approved for NASAL specimens only), is one component of a comprehensive MRSA colonization surveillance program. It is not intended to diagnose MRSA infection nor to guide or monitor treatment for MRSA infections. RESULT CALLED TO, READ BACK BY AND VERIFIED WITH: Avie EchevariaJ ASARO,RN @0249  02/24/17 MKELLY Performed at [facility], [address] [place], [place] [address]      Studies: Ct Head W Or Wo Contrast  Result Date: 02/23/2017 CLINICAL DATA:  Headache.  Scalp abscess. EXAM: CT HEAD WITHOUT AND WITH CONTRAST TECHNIQUE: Contiguous axial images were obtained from the base of the skull through the vertex without and with intravenous contrast CONTRAST:  80mL ISOVUE-300 IOPAMIDOL (ISOVUE-300) INJECTION 61% COMPARISON:  None. FINDINGS: Brain: No mass lesion, intraparenchymal hemorrhage or extra-axial collection. No evidence of acute cortical infarct. Brain parenchyma and CSF-containing spaces are normal for age. Vascular: No hyperdense vessel or unexpected calcification. Skull: Normal visualized skull base and calvarium. Sinuses/Orbits: No sinus fluid levels or advanced mucosal thickening. No mastoid effusion. Normal orbits. Other: Within the right posterior scalp soft tissues, there is extensive subcutaneous induration with edema of the underlying musculature. There is no fluid collection or abscess. There are 2 enlarged soft tissue nodules measuring approximately 1 cm each, likely enlarged lymph nodes. IMPRESSION: 1. Normal brain. 2. Right posterior scalp cellulitis and probable underlying myositis. No abscess or drainable fluid collection. Electronically Signed   By: Deatra RobinsonKevin  Herman M.D.   On: 02/23/2017 15:21    Scheduled Meds: . amLODipine  10 mg Oral Daily  . Chlorhexidine Gluconate Cloth  6 each Topical Q0600  . insulin aspart  0-15 Units Subcutaneous TID WC  . insulin aspart  0-5 Units Subcutaneous QHS  . insulin glargine  90  Units Subcutaneous QHS  . lisinopril  40 mg Oral Daily  . mupirocin ointment  1 application Nasal BID  . potassium chloride  40 mEq Oral Once  . pravastatin  20 mg Oral q1800    Continuous Infusions: . magnesium sulfate 1 - 4 g bolus IVPB    . piperacillin-tazobactam (ZOSYN)  IV 3.375 g (02/24/17 0508)  . vancomycin Stopped (02/24/17 0502)     Time spent: 25 mins I have personally reviewed and interpreted on  02/24/2017 daily labs, tele strips, imagings as discussed above under date review session and assessment and plans.  I reviewed all nursing notes, pharmacy notes, consultant notes,  vitals, pertinent old records  I have discussed plan of care as described above with RN , patient and family on 02/24/2017   Albertine GratesFang Blessed Girdner MD, PhD  [facility] Pager [phone]. If 7PM-7AM, please contact night-coverage at  [email or web address], password [email or web address] 02/24/2017, 8:04 AM  LOS: 1 day

## 2017-02-24 NOTE — Progress Notes (Signed)
Inpatient Diabetes Program Recommendations  AACE/ADA: New Consensus Statement on Inpatient Glycemic Control (2015)  Target Ranges:  Prepandial:   less than 140 mg/dL      Peak postprandial:   less than 180 mg/dL (1-2 hours)      Critically ill patients:  140 - 180 mg/dL   Review of Glycemic Control  Diabetes history: DM 2 Outpatient Diabetes medications: 70/30 93 units breakfast, 95 units lunch, 100 units supper Current orders for Inpatient glycemic control: Lantus 90 units, Novolog Moderate 0-15 units tid + Novolog HS scale 0-5 units qhs  Inpatient Diabetes Program Recommendations:    Fasting glucose 269 mg/dl this am after Lantus 90 units last night.  Consider BID dosing of Lantus 50 units BID to start tonight.  Noted A1c ordered. Will follow trends.  Thanks, [name] RN, MSN, BC-ADM, [facility] Inpatient Diabetes Coordinator Team Pager [phone] (8a-5p)

## 2017-02-25 LAB — HEPATIC FUNCTION PANEL
ALT: 14 U/L — ABNORMAL LOW (ref 17–63)
AST: 17 U/L (ref 15–41)
Albumin: 3.5 g/dL (ref 3.5–5.0)
Alkaline Phosphatase: 96 U/L (ref 38–126)
Bilirubin, Direct: 0.2 mg/dL (ref 0.1–0.5)
Indirect Bilirubin: 0.7 mg/dL (ref 0.3–0.9)
Total Bilirubin: 0.9 mg/dL (ref 0.3–1.2)
Total Protein: 7 g/dL (ref 6.5–8.1)

## 2017-02-25 LAB — BASIC METABOLIC PANEL
Anion gap: 11 (ref 5–15)
BUN: 12 mg/dL (ref 6–20)
CO2: 23 mmol/L (ref 22–32)
Calcium: 8.8 mg/dL — ABNORMAL LOW (ref 8.9–10.3)
Chloride: 102 mmol/L (ref 101–111)
Creatinine, Ser: 0.94 mg/dL (ref 0.61–1.24)
GFR calc Af Amer: >60 mL/min (ref >60)
GFR calc non Af Amer: >60 mL/min (ref >60)
Glucose, Bld: 298 mg/dL — ABNORMAL HIGH (ref 65–99)
Potassium: 3.5 mmol/L (ref 3.5–5.1)
Sodium: 136 mmol/L (ref 135–145)

## 2017-02-25 LAB — CBC
HCT: 37.4 % — ABNORMAL LOW (ref 39.0–52.0)
Hemoglobin: 12.7 g/dL — ABNORMAL LOW (ref 13.0–17.0)
MCH: 27.4 pg (ref 26.0–34.0)
MCHC: 34 g/dL (ref 30.0–36.0)
MCV: 80.6 fL (ref 78.0–100.0)
Platelets: 261 10*3/uL (ref 150–400)
RBC: 4.64 MIL/uL (ref 4.22–5.81)
RDW: 12.4 % (ref 11.5–15.5)
WBC: 8.9 10*3/uL (ref 4.0–10.5)

## 2017-02-25 LAB — GLUCOSE, CAPILLARY
Glucose-Capillary: 243 mg/dL — ABNORMAL HIGH (ref 65–99)
Glucose-Capillary: 233 mg/dL — ABNORMAL HIGH (ref 65–99)
Glucose-Capillary: 263 mg/dL — ABNORMAL HIGH (ref 65–99)
Glucose-Capillary: 351 mg/dL — ABNORMAL HIGH (ref 65–99)

## 2017-02-25 LAB — LACTIC ACID, PLASMA: Lactic Acid, Venous: 1.6 mmol/L (ref 0.5–1.9)

## 2017-02-25 LAB — MAGNESIUM: Magnesium: 1.9 mg/dL (ref 1.7–2.4)

## 2017-02-25 LAB — CK: Total CK: 214 U/L (ref 49–397)

## 2017-02-25 MED ORDER — LIVING WELL WITH DIABETES BOOK
Freq: Once | Status: AC
  Administered 2017-02-25: 15:00:00
  Filled 2017-02-25 (×2): qty 1

## 2017-02-25 MED ORDER — POTASSIUM CHLORIDE CRYS ER 20 MEQ PO TBCR
40.0000 meq | EXTENDED_RELEASE_TABLET | Freq: Once | ORAL | Status: AC
  Administered 2017-02-25: 40 meq via ORAL
  Filled 2017-02-25: qty 2

## 2017-02-25 NOTE — Progress Notes (Signed)
PROGRESS NOTE  Natan Hartog ZOX:[id number] DOB: 22-Oct-1985 DOA: 02/23/2017 PCP: System, Pcp Not In  HPI/Recap of past 24 hours:  Feeling better, no fever, tachycardia resolved  Scalp cellulitis  spreading down to lateral face on the right side and posterior neck, less tender, less indurated, continue to improve   skin blister on right upper anterior thigh stable, does not appear infected    Assessment/Plan: Active Problems:   Cellulitis of scalp   Hypokalemia   Essential hypertension   DM (diabetes mellitus), type 2 with complications (HCC)   Sepsis (HCC)   Hyponatremia  Sepsis with scalp cellulitis -Sepsis presented on presentation with sinus tachycardia ,lactic acidosis, leukocytosis -CT head: "Right posterior scalp cellulitis and probable underlying myositis. No abscess or drainable fluid collection." -HIV screening negative - Blood culture negative.  MRSA screen positive for MRSA.  Superficial wounds swab culture + STAPHYLOCOCCUS AUREUS -He is started on Vanco and Zosyn in the ED,  Continue vanc, d/c zosyn. Likely able to d/c home on doxcycline   Hypokalemia/ hypomagnesemia Improving, continue Replace K  Mag stable today Repeat lab in am  Insulin-dependent type 2 diabetes Report blood sugar running from 300-400 at home A1c pending Adjust insulin Diabetes education  Hypertension: continue home meds Norvasc and lisinopril  Hyperlipidemia: continue statin  CKD 2 renal function stable at baseline.  Morbid obesity Body mass index is 50.47 kg/m.  Code Status: Full  Family Communication: patient   Disposition Plan: home , likely on 2/25   Consultants:  None  Procedures: bedside for I&D for scalp in the ED on 2/22  Antibiotics:  Vanco from admission   Zosyn since admission to 2/24   Objective: BP 139/86 (BP Location: Left Arm)   Pulse 81   Temp 99.2 F (37.3 C) (Oral)   Resp 16   Ht 6\' 2"  (1.88 m)   Wt (!) 178.3 kg (393 lb 1.3 oz)    SpO2 100%   BMI 50.47 kg/m   Intake/Output Summary (Last 24 hours) at 02/25/2017 1523 Last data filed at 02/25/2017 0800 Gross per 24 hour  Intake 2320 ml  Output -  Net 2320 ml   Filed Weights   02/23/17 1324 02/23/17 2026  Weight: (!) 164.2 kg (362 lb) (!) 178.3 kg (393 lb 1.3 oz)    Exam: Patient is examined daily including today on 02/25/2017, exams remain the same as of yesterday except that has changed    General:  NAD, scalp cellulitis, induration done to face and neck, see pic below     Cardiovascular: RRR  Respiratory: CTABL  Abdomen: Soft/ND/NT, positive BS  Musculoskeletal: No Edema, right upper thigh blister, no erythema, see pic below  Neuro: alert, oriented     Data Reviewed: Basic Metabolic Panel: Recent Labs  Lab 02/23/17 1410 02/23/17 2124 02/24/17 0530 02/25/17 0530  NA 132*  --  134* 136  K 3.4*  --  3.4* 3.5  CL 94*  --  100* 102  CO2 25  --  22 23  GLUCOSE 355*  --  337* 298*  BUN 13  --  12 12  CREATININE 0.93  --  1.03 0.94  CALCIUM 10.4*  --  8.8* 8.8*  MG  --  1.6* 1.6* 1.9  PHOS  --   --  3.4  --    Liver Function Tests: Recent Labs  Lab 02/23/17 1410 02/24/17 0530 02/25/17 0530  AST 19 17 17   ALT 17 14* 14*  ALKPHOS 124 99 96  BILITOT 1.2 1.4* 0.9  PROT 8.8* 7.2 7.0  ALBUMIN 4.4 3.6 3.5   No results for input(s): LIPASE, AMYLASE in the last 168 hours. No results for input(s): AMMONIA in the last 168 hours. CBC: Recent Labs  Lab 02/23/17 1410 02/24/17 0530 02/25/17 0530  WBC 11.9* 12.9* 8.9  NEUTROABS 8.1*  --   --   HGB 15.8 13.9 12.7*  HCT 43.8 40.1 37.4*  MCV 75.5* 80.0 80.6  PLT 297 263 261   Cardiac Enzymes:   Recent Labs  Lab 02/25/17 0530  CKTOTAL 214   BNP (last 3 results) No results for input(s): BNP in the last 8760 hours.  ProBNP (last 3 results) No results for input(s): PROBNP in the last 8760 hours.  CBG: Recent Labs  Lab 02/24/17 1214 02/24/17 1727 02/24/17 2201 02/25/17 0750  02/25/17 1207  GLUCAP 188* 281* 306* 243* 233*    Recent Results (from the past 240 hour(s))  Blood culture (routine x 2)     Status: None (Preliminary result)   Collection Time: 02/23/17  2:11 PM  Result Value Ref Range Status   Specimen Description   Final    BLOOD RIGHT ANTECUBITAL Performed at [facility] [place], [address] [place], [place] [address]    Special Requests   Final    BOTTLES DRAWN AEROBIC AND ANAEROBIC Blood Culture adequate volume Performed at [facility] [place], [address] [place], [place] [address]    Culture   Final    NO GROWTH < 24 HOURS Performed at [facility], [address] [place], [place] [address]    Report Status PENDING  Incomplete  Blood culture (routine x 2)     Status: None (Preliminary result)   Collection Time: 02/23/17  4:34 PM  Result Value Ref Range Status   Specimen Description   Final    BLOOD LEFT ANTECUBITAL Performed at [facility] [place], [address] [place], [place] [address]    Special Requests   Final    BOTTLES DRAWN AEROBIC AND ANAEROBIC Blood Culture adequate volume Performed at [facility] [place], [address] [place], [place] [address]    Culture   Final    NO GROWTH < 24 HOURS Performed at [facility], [address] [place], [place] [address]    Report Status PENDING  Incomplete  MRSA PCR Screening     Status: Abnormal   Collection Time: 02/23/17  9:41 PM  Result Value Ref Range Status   MRSA by PCR POSITIVE (A) NEGATIVE Final    Comment:        The GeneXpert MRSA Assay (FDA approved for NASAL specimens only), is one component of a comprehensive MRSA colonization surveillance program. It is not intended to diagnose MRSA infection nor to guide or monitor treatment for MRSA infections. RESULT CALLED TO, READ BACK BY AND VERIFIED WITH: Avie EchevariaJ ASARO,RN @0249  02/24/17 MKELLY Performed at [facility], [address] [place], [place] [address]     Aerobic Culture (superficial specimen)     Status: None (Preliminary result)   Collection Time: 02/24/17 10:23 AM  Result Value Ref Range Status   Specimen Description   Final    SCALP Performed at [facility], [address] [place], [place] [address]    Special Requests   Final    NONE Performed at [facility], [address] [place], [place] [address]  Gram Stain   Final    RARE WBC PRESENT,BOTH PMN AND MONONUCLEAR RARE GRAM POSITIVE COCCI    Culture   Final    FEW STAPHYLOCOCCUS AUREUS SUSCEPTIBILITIES TO FOLLOW Performed at [facility], [address], [place], [place] [address]    Report Status PENDING  Incomplete     Studies: No results found.  Scheduled Meds: . amLODipine  10 mg Oral Daily  . Chlorhexidine Gluconate Cloth  6 each Topical Q0600  . insulin aspart  0-15 Units Subcutaneous TID WC  . insulin aspart  0-5 Units Subcutaneous QHS  . insulin glargine  90 Units Subcutaneous QHS  . lisinopril  40 mg Oral Daily  . mupirocin ointment  1 application Nasal BID  . pravastatin  20 mg Oral q1800    Continuous Infusions: . magnesium sulfate 1 - 4 g bolus IVPB    . vancomycin Stopped (02/25/17 0955)     Time spent: 25 mins I have personally reviewed and interpreted on  02/25/2017 daily labs, tele strips, imagings as discussed above under date review session and assessment and plans.  I reviewed all nursing notes, pharmacy notes,   vitals, pertinent old records  I have discussed plan of care as described above with RN , patient on 02/25/2017   Albertine Grates MD, PhD  [facility] Pager [phone]. If 7PM-7AM, please contact night-coverage at [email or web address], password [facility] 02/25/2017, 3:23 PM  LOS: 2 days

## 2017-02-26 DIAGNOSIS — N182 Chronic kidney disease, stage 2 (mild): Secondary | ICD-10-CM

## 2017-02-26 LAB — CBC WITH DIFFERENTIAL/PLATELET
Basophils Absolute: 0 10*3/uL (ref 0.0–0.1)
Basophils Relative: 0 %
Eosinophils Absolute: 0.2 10*3/uL (ref 0.0–0.7)
Eosinophils Relative: 3 %
HCT: 37.1 % — ABNORMAL LOW (ref 39.0–52.0)
Hemoglobin: 12.7 g/dL — ABNORMAL LOW (ref 13.0–17.0)
Lymphocytes Relative: 30 %
Lymphs Abs: 2.4 10*3/uL (ref 0.7–4.0)
MCH: 27.5 pg (ref 26.0–34.0)
MCHC: 34.2 g/dL (ref 30.0–36.0)
MCV: 80.3 fL (ref 78.0–100.0)
Monocytes Absolute: 0.4 10*3/uL (ref 0.1–1.0)
Monocytes Relative: 5 %
Neutro Abs: 5 10*3/uL (ref 1.7–7.7)
Neutrophils Relative %: 62 %
Platelets: 282 10*3/uL (ref 150–400)
RBC: 4.62 MIL/uL (ref 4.22–5.81)
RDW: 12.1 % (ref 11.5–15.5)
WBC: 8 10*3/uL (ref 4.0–10.5)

## 2017-02-26 LAB — BASIC METABOLIC PANEL
Anion gap: 11 (ref 5–15)
BUN: 9 mg/dL (ref 6–20)
CO2: 23 mmol/L (ref 22–32)
Calcium: 9.2 mg/dL (ref 8.9–10.3)
Chloride: 103 mmol/L (ref 101–111)
Creatinine, Ser: 0.91 mg/dL (ref 0.61–1.24)
GFR calc Af Amer: >60 mL/min (ref >60)
GFR calc non Af Amer: >60 mL/min (ref >60)
Glucose, Bld: 334 mg/dL — ABNORMAL HIGH (ref 65–99)
Potassium: 4.2 mmol/L (ref 3.5–5.1)
Sodium: 137 mmol/L (ref 135–145)

## 2017-02-26 LAB — HEMOGLOBIN A1C
Hgb A1c MFr Bld: 11.3 % — ABNORMAL HIGH (ref 4.8–5.6)
Mean Plasma Glucose: 278 mg/dL

## 2017-02-26 LAB — GLUCOSE, CAPILLARY: Glucose-Capillary: 349 mg/dL — ABNORMAL HIGH (ref 65–99)

## 2017-02-26 LAB — MAGNESIUM: Magnesium: 2 mg/dL (ref 1.7–2.4)

## 2017-02-26 MED ORDER — INSULIN DETEMIR 100 UNIT/ML ~~LOC~~ SOLN
50.0000 [IU] | Freq: Two times a day (BID) | SUBCUTANEOUS | Status: DC
  Administered 2017-02-26: 50 [IU] via SUBCUTANEOUS
  Filled 2017-02-26 (×2): qty 0.5

## 2017-02-26 MED ORDER — INSULIN ASPART PROT & ASPART (70-30 MIX) 100 UNIT/ML ~~LOC~~ SUSP: 30.0000 [IU] | Freq: Three times a day (TID) | SUBCUTANEOUS | 0 refills | Status: DC

## 2017-02-26 MED ORDER — INSULIN DETEMIR 100 UNIT/ML ~~LOC~~ SOLN: 50.0000 [IU] | Freq: Two times a day (BID) | SUBCUTANEOUS | 0 refills | Status: AC

## 2017-02-26 MED ORDER — INSULIN ASPART PROT & ASPART (70-30 MIX) 100 UNIT/ML ~~LOC~~ SUSP: 30.0000 [IU] | Freq: Three times a day (TID) | SUBCUTANEOUS | 0 refills | Status: AC

## 2017-02-26 MED ORDER — DOXYCYCLINE HYCLATE 100 MG PO CAPS: 100.0000 mg | ORAL_CAPSULE | Freq: Two times a day (BID) | ORAL | 0 refills | Status: AC

## 2017-02-26 MED ORDER — DOXYCYCLINE HYCLATE 100 MG PO TABS
100.0000 mg | ORAL_TABLET | Freq: Two times a day (BID) | ORAL | Status: DC
  Administered 2017-02-26: 100 mg via ORAL
  Filled 2017-02-26: qty 1

## 2017-02-26 NOTE — Progress Notes (Addendum)
Inpatient Diabetes Program Recommendations  AACE/ADA: New Consensus Statement on Inpatient Glycemic Control (2015)  Target Ranges:  Prepandial:   less than 140 mg/dL      Peak postprandial:   less than 180 mg/dL (1-2 hours)      Critically ill patients:  140 - 180 mg/dL   Results for Curtis Rogers, Curtis Rogers (MRN [id number]) as of 02/26/2017 09:40  Ref. Range 02/25/2017 07:50 02/25/2017 12:07 02/25/2017 16:38 02/25/2017 22:22  Glucose-Capillary Latest Ref Range: 65 - 99 mg/dL 409243 (H)  5 units Novolog 233 (H)  5 units Novolog 263 (H)  5 units Novolog 351 (H)  5 units Novolog +  90 units Lantus   Results for Curtis Rogers, Curtis Rogers (MRN [id number]) as of 02/26/2017 09:40  Ref. Range 02/26/2017 08:01  Glucose-Capillary Latest Ref Range: 65 - 99 mg/dL 956349 (H)  11 units Novolog   Results for Curtis Rogers, Curtis Rogers (MRN [id number]) as of 02/26/2017 09:40  Ref. Range 02/25/2017 05:30  Hemoglobin A1C Latest Ref Range: 4.8 - 5.6 % 11.3 (H)  Average glucose 278 mg/dl    Home DM Meds: 46/9670/30 Insulin 93 units breakfast, 95 units lunch, 100 units supper         Levemir 15 units BID   Current Orders: Lantus 90 units QHS      Novolog Moderate Correction Scale/ SSI (0-15 units) TID AC + HS     Patient just saw his PCP (Dr. Rexene [facility]Everhart [facility]) on 01/26/17.  Not sure if any changes made to home DM regimen.  Current A1c of 11.3% shows very poor glucose control at home.   MD- Please consider the following in-hospital insulin adjustments:  1. Reduce Lantus to 15 units BID (home dose)  2. Start 70/30 Insulin- 75 units TID with meals (approximately 80% total home dose)   Addendum 11am- Spoke with Dr. Roda ShuttersXu this AM.  Dr. Roda ShuttersXu would like to discharge patient home on Levemir 50 units BID + 70/30 Insulin 30 units TID.  Once pt runs out of 70/30 Insulin, he will need to follow-up with PCP to obtain a Rx for Novolog for rapid-acting insulin needs.     --Will follow  patient during hospitalization--  Ambrose FinlandJeannine Johnston Tashe Purdon RN, MSN, CDE Diabetes Coordinator Inpatient Glycemic Control Team Team Pager: [phone] (8a-5p)

## 2017-02-26 NOTE — Progress Notes (Signed)
  RD consulted for nutrition education regarding diabetes.   Lab Results  Component Value Date   HGBA1C 11.3 (H) 02/25/2017    RD provided "Carbohydrate Counting for People with Diabetes" handout from the Academy of Nutrition and Dietetics. Discussed different food groups and their effects on blood sugar, emphasizing carbohydrate-containing foods. Provided list of carbohydrates and recommended serving sizes of common foods.  Discussed importance of controlled and consistent carbohydrate intake throughout the day. Provided examples of ways to balance meals/snacks and encouraged intake of high-fiber, whole grain complex carbohydrates. Teach back method used.  Pt admits to eating larger portions for lunch or dinner. Discussed eating 3 meals with snacks each day following the balanced plate model. Discussed beverage alternatives and the effect soda has on blood sugar. Pt seems very motivated to make changes.   Expect good compliance.  Body mass index is 50.47 kg/m. Pt meets criteria for morbid obesity based on current BMI.  Current diet order is low sodium heart healthy, patient is consuming approximately 100% of meals at this time. Labs and medications reviewed. No further nutrition interventions warranted at this time. RD contact information provided. If additional nutrition issues arise, please re-consult RD.  Vanessa Kickarly Dashiell Franchino RD, LDN Clinical Nutrition Pager # [phone]- [phone]

## 2017-02-26 NOTE — Care Management Note (Signed)
Case Management Note  Patient Details  Name: Curtis Rogers MRN: [id number] Date of Birth: 05/03/1985  Subjective/Objective: 32 y/o m admitted w/scalp cellulitis. From home. No health insurance.Provided w/health insurance resources. Patient has pcp,pharmacy,has been able to afford meds. No further CM needs.                   Action/Plan:d/c home.   Expected Discharge Date:  02/26/17               Expected Discharge Plan:  Home/Self Care  In-House Referral:     Discharge planning Services  CM Consult  Post Acute Care Choice:    Choice offered to:     DME Arranged:    DME Agency:     HH Arranged:    HH Agency:     Status of Service:  Completed, signed off  If discussed at [employer] Length of Stay Meetings, dates discussed:    Additional Comments:  Curtis Rogers, Curtis Dobek, RN 02/26/2017, 10:24 AM

## 2017-02-26 NOTE — Discharge Summary (Signed)
Discharge Summary  Curtis Rogers ZOX:[id number] DOB: 10/22/1985  PCP: Curtis Hipp, PA  Admit date: 02/23/2017 Discharge date: 02/26/2017  Time spent: >58mins, more than 50% time spent on coordination of care. Patient education regarding insulin regimen and diabetes management.  Recommendations for Outpatient Follow-up:  1. F/u with PMD within a week  for hospital discharge follow up, repeat cbc/bmp at follow up.  Discharge Diagnoses:  Active Hospital Problems   Diagnosis Date Noted  . Cellulitis of scalp 02/23/2017  . Hypokalemia 02/23/2017  . Essential hypertension 02/23/2017  . 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 Sepsis (HCC) 02/23/2017  . Hyponatremia 02/23/2017    Resolved Hospital Problems  No resolved problems to display.    Discharge Condition: stable  Diet recommendation: heart healthy/carb modified  Filed Weights   02/23/17 1324 02/23/17 2026  Weight: (!) 164.2 kg (362 lb) (!) 178.3 kg (393 lb 1.3 oz)    History of present illness: (per admitting MD Curtis Rogers) Chief Complaint: Swelling of the scalp and fever  HPI: Curtis Rogers is a 32 y.o. male with medical history significant of DM2 HTN, hypercholesterolemia, obesity    Presented with 3 weeks of swelling concerning for folliculitis described the patient is "hair bumps" attempted to use warm compresses as well as drainage of the area but only worsened and filled up with pus.  For past 5 days he has had significant worsening in his symptoms with swelling extending to right posterior area of the scalp severely painful to touch.  Developed fevers and diaphoresis nausea but no vomiting.      While in ER: Originally presented to med [employer] sepsis criteria given tachycardia increased respirations and elevated WBC and lactic acid Blood cultures were obtained Abscess was opened and drained by ER provider Patient was started on vancomycin and  Zosyn 2 L no saline administered  Significant initial  Findings:  WBC 11.9 Na132 K3.4 Cr 0.93 Potassium 3.4 Glucose 355  lactic acid 2.54>1.94 CT head posterior scalp cellulitis probable underlying myositis but no abscess at this point  ER provider discussed case with: Curtis Rogers who recommends: Admission for IV antibiotics if no improvement patient will need to be transferred to [facility] for further evaluation   IN ER:  Temp (24hrs), Avg:98.1 F (36.7 C), Min:98 F (36.7 C), Max:98.2 F (36.8 C)      on arrival      ED Triage Vitals  Enc Vitals Group     BP 02/23/17 1325 (!) 150/109     Pulse Rate 02/23/17 1325 (!) 122     Resp 02/23/17 1325 18     Temp 02/23/17 1325 98.1 F (36.7 C)     Temp Source 02/23/17 1325 Oral     SpO2 02/23/17 1325 99 %     Weight 02/23/17 1324 (!) 362 lb (164.2 kg)     Height 02/23/17 1324 6\' 2"  (1.88 m)     Head Circumference --      Peak Flow --      Pain Score 02/23/17 1324 10     Pain Loc --      Pain Edu? --      Excl. in GC? --     Latest temperature 98.2 RR 20 setting 100% HR 119 BP 168/105 Following Medications were ordered in ER: Medications  vancomycin (VANCOCIN) 1,500 mg in sodium chloride 0.9 % 500 mL IVPB (not administered)  piperacillin-tazobactam (ZOSYN) IVPB 3.375 g (not administered)  vancomycin (VANCOCIN)  500 MG powder (not administered)  insulin aspart protamine- aspart (NOVOLOG MIX 70/30) injection 93 Units (not administered)  lidocaine-EPINEPHrine (XYLOCAINE W/EPI) 2 %-1:200000 (PF) injection 10 mL (10 mLs Intradermal Given 02/23/17 1358)  sodium chloride 0.9 % bolus 1,000 mL (0 mLs Intravenous Stopped 02/23/17 1456)  iopamidol (ISOVUE-300) 61 % injection 100 mL (80 mLs Intravenous Contrast Given 02/23/17 1500)  vancomycin (VANCOCIN) 2,000 mg in sodium chloride 0.9 % 500 mL IVPB (0 mg Intravenous Stopped 02/23/17 1800)  sodium chloride 0.9 % bolus 1,000 mL (0 mLs Intravenous Stopped 02/23/17 1706)  morphine 4 MG/ML  injection 4 mg (4 mg Intravenous Given 02/23/17 1557)  piperacillin-tazobactam (ZOSYN) IVPB 3.375 g (0 g Intravenous Stopped 02/23/17 1703)  sodium chloride 0.9 % bolus 1,000 mL (0 mLs Intravenous Stopped 02/23/17 1827)  morphine 4 MG/ML injection 4 mg (4 mg Intravenous Given 02/23/17 1750)      Hospitalist was called for admission for scalp cellulitis and sepsis  Regarding pertinent Chronic problems: History of diabetes on insulin 70/30 reported good control blood sugars  history of hypertension on lisinopril       Hospital Course:  Active Problems:   Cellulitis of scalp   Hypokalemia   Essential hypertension   DM (diabetes mellitus), type 2 with complications (HCC)   Sepsis (HCC)   Hyponatremia   Sepsis with scalp cellulitis -Sepsis presented on presentation with sinus tachycardia ,lactic acidosis, leukocytosis. -CT head: "Right posterior scalp cellulitis and probable underlying myositis. No abscess or drainable fluid collection." -HIV screening negative -bedsidefor I&D for scalpin the ED on 2/22 - Blood culture negative.  MRSA screen positive for MRSA.  Superficial wounds swab culture + STAPHYLOCOCCUS AUREUS -He is started on Vanco and Zosyn in the ED,  Continue vanc, d/c zosyn.  Improving, he is  d/ced home on doxcycline.  Hypokalemia/ hypomagnesemia Replaced and Normalized.     Insulin-dependent type 2 diabetes Report blood sugar running from 300-400 at home A1c 11.3 He reports home insulin regimen: "novolog 70/30 Insulin 93 units breakfast, 95 units lunch, 100 units supper  And  Levemir 15 units " He reports has been on this regimen for a year.  And he just bought an ample supply of both of this medication. Therefore I recommended patient discharged on Levemir 50 units twice daily, decrease 70/30-30 units 3 times daily with meal.  I have advised patient to close follow-up with primary care doctor continue diabetes management and insulin regimen adjustment.   I have discussed with him once he ran out of 70/ 30, consider change to NovoLog for short acting coverage. Extensive Diabetes education provided.  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  Morbid obesity Body mass index is 50.47 kg/m. Encouraged weight loss  Code Status: Full  Family Communication: patient and husband at bedside  Disposition Plan: home on 2/25   Consultants:  Diabetes coordinator  Dietitian  Procedures: bedsidefor I&D for scalpin the ED on 2/22  Antibiotics:  Vanco from admission   Zosyn since admission to 2/24    Discharge Exam: BP (!) 155/88 (BP Location: Left Arm)   Pulse 91   Temp 98.2 F (36.8 C) (Oral)   Resp 18   Ht 6\' 2"  (1.88 m)   Wt (!) 178.3 kg (393 lb 1.3 oz)   SpO2 100%   BMI 50.47 kg/m   General: obese, NAD, scalp cellulitis has much improved Cardiovascular: RRR Respiratory: CTABL Thigh blister does not appear infected.  Discharge Instructions You were cared for by a hospitalist during your hospital stay. If you have any questions about your discharge medications or the care you received while you were in the hospital after you are discharged, you can call the unit and asked to speak with the hospitalist on call if the hospitalist that took care of you is not available. Once you are discharged, your primary care physician will handle any further medical issues. Please note that NO REFILLS for any discharge medications will be authorized once you are discharged, as it is imperative that you return to your primary care physician (or establish a relationship with a primary care physician if you do not have one) for your aftercare needs so that they can reassess your need for medications and monitor your lab values.  Discharge Instructions    Diet - low sodium heart healthy   Complete by:  As directed    Carb modified    Increase activity slowly   Complete by:  As directed      Allergies as of 02/26/2017   No Known Allergies     Medication List    STOP taking these medications   azithromycin 250 MG tablet Commonly known as:  ZITHROMAX     TAKE these medications   amLODipine 10 MG tablet Commonly known as:  NORVASC Take 10 mg by mouth at bedtime.   doxycycline 100 MG capsule Commonly known as:  VIBRAMYCIN Take 1 capsule (100 mg total) by mouth 2 (two) times daily for 8 days.   insulin aspart protamine- aspart (70-30) 100 UNIT/ML injection Commonly known as:  NOVOLOG MIX 70/30 Inject 0.3 mLs (30 Units total) into the skin 3 (three) times daily. What changed:    how much to take  additional instructions   insulin detemir 100 UNIT/ML injection Commonly known as:  LEVEMIR Inject 0.5 mLs (50 Units total) into the skin 2 (two) times daily. What changed:  how much to take   lisinopril 40 MG tablet Commonly known as:  PRINIVIL,ZESTRIL Take 40 mg by mouth daily.   lovastatin 20 MG 24 hr tablet Commonly known as:  ALTOPREV Take 20 mg by mouth at bedtime.   STOOL SOFTENER PO Take 1 tablet by mouth 2 (two) times daily.      No Known Allergies Follow-up Information    [place], [place], [place] Follow up in 1 week(s).   Specialty:  General Practice Why:  hospital discharge follow up, please check your blood glucose at home and bring in record for your doctor to review. and continue adjust insulin dose. please note, your insulin dose will likely need to be decreased if you loose weight.  Contact information: [address] [place] [place] [address] [phone]            The results of significant diagnostics from this hospitalization (including imaging, microbiology, ancillary and laboratory) are listed below for reference.    Significant Diagnostic Studies: Ct Head W Or Wo Contrast  Result Date: 02/23/2017 CLINICAL DATA:  Headache.  Scalp abscess. EXAM: CT HEAD WITHOUT AND  WITH CONTRAST TECHNIQUE: Contiguous axial images were obtained from the base of the skull through the vertex without and with intravenous contrast CONTRAST:  80mL ISOVUE-300 IOPAMIDOL (ISOVUE-300) INJECTION 61% COMPARISON:  None. FINDINGS: Brain: No mass lesion, intraparenchymal hemorrhage or extra-axial collection. No evidence of acute cortical infarct. Brain parenchyma and CSF-containing spaces are normal for age. Vascular: No hyperdense vessel or unexpected calcification. Skull: Normal visualized skull base and calvarium. Sinuses/Orbits:  No sinus fluid levels or advanced mucosal thickening. No mastoid effusion. Normal orbits. Other: Within the right posterior scalp soft tissues, there is extensive subcutaneous induration with edema of the underlying musculature. There is no fluid collection or abscess. There are 2 enlarged soft tissue nodules measuring approximately 1 cm each, likely enlarged lymph nodes. IMPRESSION: 1. Normal brain. 2. Right posterior scalp cellulitis and probable underlying myositis. No abscess or drainable fluid collection. Electronically Signed   By: Deatra RobinsonKevin  Herman M.D.   On: 02/23/2017 15:21    Microbiology: Recent Results (from the past 240 hour(s))  Blood culture (routine x 2)     Status: None (Preliminary result)   Collection Time: 02/23/17  2:11 PM  Result Value Ref Range Status   Specimen Description   Final    BLOOD RIGHT ANTECUBITAL Performed at [facility], [address] [place], [place] [address]    Special Requests   Final    BOTTLES DRAWN AEROBIC AND ANAEROBIC Blood Culture adequate volume Performed at [facility], [address] [place], [place] [address]    Culture   Final    NO GROWTH 2 DAYS Performed at [facility], [address] [place], [place] [address]    Report Status PENDING  Incomplete  Blood culture (routine x 2)     Status: None (Preliminary result)   Collection Time: 02/23/17  4:34 PM  Result Value Ref Range  Status   Specimen Description   Final    BLOOD LEFT ANTECUBITAL Performed at [facility] [place] [facility], [address] [place], [place] [address]    Special Requests   Final    BOTTLES DRAWN AEROBIC AND ANAEROBIC Blood Culture adequate volume Performed at [facility], [address] [place], [place] [address]    Culture   Final    NO GROWTH 2 DAYS Performed at [facility], [address] [place], [place] [address]    Report Status PENDING  Incomplete  MRSA PCR Screening     Status: Abnormal   Collection Time: 02/23/17  9:41 PM  Result Value Ref Range Status   MRSA by PCR POSITIVE (A) NEGATIVE Final    Comment:        The GeneXpert MRSA Assay (FDA approved for NASAL specimens only), is one component of a comprehensive MRSA colonization surveillance program. It is not intended to diagnose MRSA infection nor to guide or monitor treatment for MRSA infections. RESULT CALLED TO, READ BACK BY AND VERIFIED WITH: Avie EchevariaJ ASARO,RN @0249  02/24/17 MKELLY Performed at [facility], [address] [place], [place] [address]   Aerobic Culture (superficial specimen)     Status: None (Preliminary result)   Collection Time: 02/24/17 10:23 AM  Result Value Ref Range Status   Specimen Description   Final    SCALP Performed at [facility], [address] [place], [place] [address]    Special Requests   Final    NONE Performed at [facility], [address] [place], [place] [address]    Gram Stain   Final    RARE WBC PRESENT,BOTH PMN AND MONONUCLEAR RARE GRAM POSITIVE COCCI    Culture   Final    FEW STAPHYLOCOCCUS AUREUS SUSCEPTIBILITIES TO FOLLOW Performed at [facility], [address] [place], [place] [address]    Report Status PENDING  Incomplete     Labs: Basic Metabolic Panel: Recent Labs  Lab 02/23/17 1410 02/23/17 2124 02/24/17 0530 02/25/17 0530 02/26/17 0652  NA 132*  --  134* 136  137  K 3.4*  --  3.4* 3.5 4.2  CL 94*  --  100* 102 103  CO2 25  --  22 23 23   GLUCOSE 355*  --  337* 298* 334*  BUN 13  --  12 12 9   CREATININE 0.93  --  1.03 0.94 0.91  CALCIUM 10.4*  --  8.8* 8.8* 9.2  MG  --  1.6* 1.6* 1.9 2.0  PHOS  --   --  3.4  --   --    Liver Function Tests: Recent Labs  Lab 02/23/17 1410 02/24/17 0530 02/25/17 0530  AST 19 17 17   ALT 17 14* 14*  ALKPHOS 124 99 96  BILITOT 1.2 1.4* 0.9  PROT 8.8* 7.2 7.0  ALBUMIN 4.4 3.6 3.5   No results for input(s): LIPASE, AMYLASE in the last 168 hours. No results for input(s): AMMONIA in the last 168 hours. CBC: Recent Labs  Lab 02/23/17 1410 02/24/17 0530 02/25/17 0530 02/26/17 0652  WBC 11.9* 12.9* 8.9 8.0  NEUTROABS 8.1*  --   --  5.0  HGB 15.8 13.9 12.7* 12.7*  HCT 43.8 40.1 37.4* 37.1*  MCV 75.5* 80.0 80.6 80.3  PLT 297 263 261 282   Cardiac Enzymes: Recent Labs  Lab 02/25/17 0530  CKTOTAL 214   BNP: BNP (last 3 results) No results for input(s): BNP in the last 8760 hours.  ProBNP (last 3 results) No results for input(s): PROBNP in the last 8760 hours.  CBG: Recent Labs  Lab 02/25/17 0750 02/25/17 1207 02/25/17 1638 02/25/17 2222 02/26/17 0801  GLUCAP 243* 233* 263* 351* 349*       Signed:  Albertine Grates MD, PhD  [facility] 02/26/2017, 11:38 AM

## 2017-02-27 LAB — AEROBIC CULTURE W GRAM STAIN (SUPERFICIAL SPECIMEN)

## 2017-02-28 LAB — CULTURE, BLOOD (ROUTINE X 2)
Culture: NO GROWTH
Culture: NO GROWTH
Special Requests: ADEQUATE
Special Requests: ADEQUATE

## 2017-03-22 ENCOUNTER — Emergency Department (HOSPITAL_BASED_OUTPATIENT_CLINIC_OR_DEPARTMENT_OTHER)
Admission: EM | Admit: 2017-03-22 | Discharge: 2017-03-22 | Disposition: A | Discharge status: Home / Self Care | Payer: Self-pay | Attending: Emergency Medicine | Admitting: Emergency Medicine

## 2017-03-22 ENCOUNTER — Other Ambulatory Visit: Payer: Self-pay

## 2017-03-22 ENCOUNTER — Encounter (HOSPITAL_BASED_OUTPATIENT_CLINIC_OR_DEPARTMENT_OTHER): Payer: Self-pay | Admitting: Emergency Medicine

## 2017-03-22 DIAGNOSIS — I10 Essential (primary) hypertension: Secondary | ICD-10-CM | POA: Insufficient documentation

## 2017-03-22 DIAGNOSIS — Z794 Long term (current) use of insulin: Secondary | ICD-10-CM | POA: Insufficient documentation

## 2017-03-22 DIAGNOSIS — L02811 Cutaneous abscess of head [any part, except face]: Secondary | ICD-10-CM | POA: Insufficient documentation

## 2017-03-22 DIAGNOSIS — Z79899 Other long term (current) drug therapy: Secondary | ICD-10-CM | POA: Insufficient documentation

## 2017-03-22 DIAGNOSIS — E119 Type 2 diabetes mellitus without complications: Secondary | ICD-10-CM | POA: Insufficient documentation

## 2017-03-22 MED ORDER — DOXYCYCLINE HYCLATE 100 MG PO CAPS: 100.0000 mg | ORAL_CAPSULE | Freq: Two times a day (BID) | ORAL | 0 refills | Status: AC

## 2017-03-22 MED ORDER — LIDOCAINE-EPINEPHRINE (PF) 2 %-1:200000 IJ SOLN
10.0000 mL | Freq: Once | INTRAMUSCULAR | Status: AC
  Administered 2017-03-22: 10 mL

## 2017-03-22 MED FILL — DOXYCYCLINE HYCLATE 100 MG: 100 | 10 days supply | Qty: 20 | Fill #0

## 2017-03-22 NOTE — ED Notes (Signed)
Supplies gathered and placed at bedside for md. 

## 2017-03-22 NOTE — Discharge Instructions (Signed)

## 2017-03-22 NOTE — ED Triage Notes (Signed)
Abscess to side of head. Pt had one drained here about a month ago.

## 2017-03-22 NOTE — ED Provider Notes (Signed)
[facility] EMERGENCY DEPARTMENT Provider Note   CSN: [id number] Arrival date & time: 03/22/17  [id number]     History   Chief Complaint Chief Complaint  Patient presents with  . Abscess    HPI Curtis Rogers is a 32 y.o. male with a history of diabetes, hypertension, who presents today for evaluation of a scalp abscess.  He reports that approximately 1 month ago he had a similar event when he had to be admitted to the hospital for sepsis.  He reports that last night he noticed 2 small areas of swelling on his scalp, one on the right side and then 1 on his left-sided upper neck.  He denies any fevers or chills, no nausea vomiting diarrhea, reports that he is otherwise well.  He reports compliance with his medications at home.  HPI  Past Medical History:  Diagnosis Date  . Diabetes mellitus without complication (HCC)   . Hypertension     Patient Active Problem List   Diagnosis Date Noted  . Cellulitis of scalp 02/23/2017  . Hypokalemia 02/23/2017  . Essential hypertension 02/23/2017  . 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 Sepsis (HCC) 02/23/2017  . Hyponatremia 02/23/2017    History reviewed. No pertinent surgical history.     Home Medications    Prior to Admission medications   Medication Sig Start Date End Date Taking? Authorizing Provider  amLODipine (NORVASC) 10 MG tablet Take 10 mg by mouth at bedtime.    Yes [provider]  Docusate Calcium (STOOL SOFTENER PO) Take 1 tablet by mouth 2 (two) times daily.   Yes [provider]  insulin aspart protamine- aspart (NOVOLOG MIX 70/30) (70-30) 100 UNIT/ML injection Inject 0.3 mLs (30 Units total) into the skin 3 (three) times daily. 02/26/17  Yes Albertine GratesXu, Fang, MD  insulin detemir (LEVEMIR) 100 UNIT/ML injection Inject 0.5 mLs (50 Units total) into the skin 2 (two) times daily. 02/26/17  Yes Albertine GratesXu, Fang, MD  lisinopril (PRINIVIL,ZESTRIL) 40 MG tablet Take 40 mg by mouth  daily.    Yes [provider]  lovastatin (ALTOPREV) 20 MG 24 hr tablet Take 20 mg by mouth at bedtime.   Yes [provider]  doxycycline (VIBRAMYCIN) 100 MG capsule Take 1 capsule (100 mg total) by mouth 2 (two) times daily. 03/22/17   Cristina GongHammond, Malyna Budney W, PA-C    Family History Family History  Problem Relation Age of Onset  . Stroke Mother   . Diabetes Father   . Diabetes Other   . Breast cancer Other   . CAD Neg Hx     Social History Social History   Tobacco Use  . Smoking status: Never Smoker  . Smokeless tobacco: Never Used  Substance Use Topics  . Alcohol use: No  . Drug use: No     Allergies   Patient has no known allergies.   Review of Systems Review of Systems  Constitutional: Negative for chills and fever.  Gastrointestinal: Negative for diarrhea, nausea and vomiting.  Skin: Positive for wound.  All other systems reviewed and are negative.    Physical Exam Updated Vital Signs BP 122/82 (BP Location: Right Arm)   Pulse 99   Temp 98.7 F (37.1 C) (Oral)   Resp 20   Ht 6\' 2"  (1.88 m)   Wt (!) 165.6 kg (365 lb)   SpO2 100%   BMI 46.86 kg/m   Physical Exam  Constitutional: He appears well-developed and well-nourished.  HENT:  Head:  Normocephalic and atraumatic.  Mouth/Throat: Oropharynx is clear and moist.  Eyes: Conjunctivae are normal. No scleral icterus.  Neck: Normal range of motion. Neck supple.  Neurological: He is alert.  Skin: Skin is warm and dry. He is not diaphoretic.  There is a small, subcentimeter area of redness and induration with a centralized pustule on the right parietal scalp.  There is no obvious drainage.  This is next to a scar from previous I and d.    On the left neck superficially there is a pustule with localized swelling.  The pusutle is raised when compared to surrounding skin.  There is no surrounding induration.    Psychiatric: He has a normal mood and affect. His behavior is normal.  Nursing note  and vitals reviewed.    ED Treatments / Results  Labs (all labs ordered are listed, but only abnormal results are displayed) Labs Reviewed - No data to display  EKG  EKG Interpretation None       Radiology No results found.  Procedures .Marland KitchenIncision and Drainage Date/Time: 03/22/2017 11:01 AM Performed by: Cristina Gong, PA-C Authorized by: Cristina Gong, PA-C   Consent:    Consent obtained:  Verbal   Consent given by:  Patient   Risks discussed:  Bleeding, incomplete drainage, pain and infection (Damage to other structures, need for additional procedures)   Alternatives discussed:  No treatment, alternative treatment and referral Location:    Type:  Abscess   Size:  1cm   Location:  Head   Head location:  Scalp (Right parietal ) Pre-procedure details:    Skin preparation:  Chloraprep Anesthesia (see MAR for exact dosages):    Anesthesia method:  Local infiltration   Local anesthetic:  Lidocaine 2% WITH epi Procedure type:    Complexity:  Complex Procedure details:    Incision types:  Stab incision   Incision depth:  Dermal   Scalpel blade:  11   Drainage:  Purulent   Drainage amount:  Scant   Packing materials:  None Post-procedure details:    Patient tolerance of procedure:  Tolerated well, no immediate complications .Marland KitchenIncision and Drainage Date/Time: 03/22/2017 11:02 AM Performed by: Cristina Gong, PA-C Authorized by: Cristina Gong, PA-C   Consent:    Consent obtained:  Verbal   Consent given by:  Patient   Risks discussed:  Bleeding, incomplete drainage, pain and infection (Damage to other structures, need for additional procedures)   Alternatives discussed:  No treatment, alternative treatment and referral Location:    Type:  Abscess   Location: Superficial left neck posterior. Pre-procedure details:    Skin preparation:  Chloraprep Anesthesia (see MAR for exact dosages):    Anesthesia method:  Local infiltration   Local  anesthetic:  Lidocaine 2% WITH epi Procedure type:    Complexity:  Complex Procedure details:    Incision types:  Stab incision   Incision depth:  Subcutaneous   Scalpel blade:  11   Wound management:  Probed and deloculated and irrigated with saline   Drainage:  Purulent   Drainage amount:  Scant   Packing materials:  None Post-procedure details:    Patient tolerance of procedure:  Tolerated well, no immediate complications Comments:     Patient has a very large neck, not concerned for damage to underlying structures.     (including critical care time)  Medications Ordered in ED Medications  lidocaine-EPINEPHrine (XYLOCAINE W/EPI) 2 %-1:200000 (PF) injection 10 mL (10 mLs Infiltration Given 03/22/17 1040)  Initial Impression / Assessment and Plan / ED Course  I have reviewed the triage vital signs and the nursing notes.  Pertinent labs & imaging results that were available during my care of the patient were reviewed by me and considered in my medical decision making (see chart for details).    Patient with two small skin abscess amenable to incision and drainage.  Abscess was not large enough to warrant packing or drain,  wound recheck in 2 days. Encouraged home warm soaks and flushing.  Mild signs of cellulitis is surrounding skin.  Will d/c to home.  Given rx for doxycycline as patient has previously tolerated well, is diabetic, and has previously been septic from similar event.  Close PCP follow up.   Final Clinical Impressions(s) / ED Diagnoses   Final diagnoses:  Abscess of scalp    ED Discharge Orders        Ordered    doxycycline (VIBRAMYCIN) 100 MG capsule  2 times daily     03/22/17 1048       Cristina Gong, [place] 03/22/17 1104    Doug Sou, MD 03/22/17 1517

## 2017-05-13 ENCOUNTER — Emergency Department (HOSPITAL_BASED_OUTPATIENT_CLINIC_OR_DEPARTMENT_OTHER)
Admission: EM | Admit: 2017-05-13 | Discharge: 2017-05-13 | Disposition: A | Discharge status: Home / Self Care | Payer: Self-pay | Attending: Emergency Medicine | Admitting: Emergency Medicine

## 2017-05-13 ENCOUNTER — Encounter (HOSPITAL_BASED_OUTPATIENT_CLINIC_OR_DEPARTMENT_OTHER): Payer: Self-pay | Admitting: Emergency Medicine

## 2017-05-13 ENCOUNTER — Other Ambulatory Visit: Payer: Self-pay

## 2017-05-13 DIAGNOSIS — E119 Type 2 diabetes mellitus without complications: Secondary | ICD-10-CM | POA: Insufficient documentation

## 2017-05-13 DIAGNOSIS — L0291 Cutaneous abscess, unspecified: Secondary | ICD-10-CM

## 2017-05-13 DIAGNOSIS — Z794 Long term (current) use of insulin: Secondary | ICD-10-CM | POA: Insufficient documentation

## 2017-05-13 DIAGNOSIS — Z79899 Other long term (current) drug therapy: Secondary | ICD-10-CM | POA: Insufficient documentation

## 2017-05-13 DIAGNOSIS — L02811 Cutaneous abscess of head [any part, except face]: Secondary | ICD-10-CM | POA: Insufficient documentation

## 2017-05-13 DIAGNOSIS — I10 Essential (primary) hypertension: Secondary | ICD-10-CM | POA: Insufficient documentation

## 2017-05-13 MED ORDER — CLINDAMYCIN HCL 150 MG PO CAPS: 150.0000 mg | ORAL_CAPSULE | Freq: Four times a day (QID) | ORAL | 0 refills | Status: AC

## 2017-05-13 NOTE — Discharge Instructions (Signed)
After getting a haircut make sure to put a hot towel on the lower part of your scalp for 15 to 20 minutes in the next fully 8 with a rough sponge or loofah sponge

## 2017-05-13 NOTE — ED Provider Notes (Signed)
[facility] EMERGENCY DEPARTMENT Provider Note   CSN: [id number] Arrival date & time: 05/13/17  0805     History   Chief Complaint Chief Complaint  Patient presents with  . Abscess    HPI Curtis Rogers is a 32 y.o. male.  The history is provided by the patient.  Abscess  Location:  Head/neck Head/neck abscess location:  Scalp Size:  Small Abscess quality: induration, painful and redness   Red streaking: no   Duration:  4 days Progression:  Worsening Pain details:    Quality:  Aching and throbbing   Severity:  Moderate   Duration:  4 days   Timing:  Constant   Progression:  Worsening Chronicity:  Recurrent Context: diabetes   Relieved by:  None tried Worsened by:  Draining/squeezing Ineffective treatments:  None tried Associated symptoms: no fever   Risk factors: prior abscess     Past Medical History:  Diagnosis Date  . Diabetes mellitus without complication (HCC)   . Hypertension     Patient Active Problem List   Diagnosis Date Noted  . Cellulitis of scalp 02/23/2017  . Hypokalemia 02/23/2017  . Essential hypertension 02/23/2017  . 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 Sepsis (HCC) 02/23/2017  . Hyponatremia 02/23/2017    History reviewed. No pertinent surgical history.      Home Medications    Prior to Admission medications   Medication Sig Start Date End Date Taking? Authorizing Provider  amLODipine (NORVASC) 10 MG tablet Take 10 mg by mouth at bedtime.     [provider]  Docusate Calcium (STOOL SOFTENER PO) Take 1 tablet by mouth 2 (two) times daily.    [provider]  doxycycline (VIBRAMYCIN) 100 MG capsule Take 1 capsule (100 mg total) by mouth 2 (two) times daily. 03/22/17   Cristina Gong, PA-C  insulin aspart protamine- aspart (NOVOLOG MIX 70/30) (70-30) 100 UNIT/ML injection Inject 0.3 mLs (30 Units total) into the skin 3 (three) times daily. 02/26/17   Albertine Grates, MD    insulin detemir (LEVEMIR) 100 UNIT/ML injection Inject 0.5 mLs (50 Units total) into the skin 2 (two) times daily. 02/26/17   Albertine Grates, MD  lisinopril (PRINIVIL,ZESTRIL) 40 MG tablet Take 40 mg by mouth daily.     [provider]  lovastatin (ALTOPREV) 20 MG 24 hr tablet Take 20 mg by mouth at bedtime.    [provider]    Family History Family History  Problem Relation Age of Onset  . Stroke Mother   . Diabetes Father   . Diabetes Other   . Breast cancer Other   . CAD Neg Hx     Social History Social History   Tobacco Use  . Smoking status: Never Smoker  . Smokeless tobacco: Never Used  Substance Use Topics  . Alcohol use: No  . Drug use: No     Allergies   Patient has no known allergies.   Review of Systems Review of Systems  Constitutional: Negative for fever.  All other systems reviewed and are negative.    Physical Exam Updated Vital Signs BP (!) 157/99 (BP Location: Right Arm)   Pulse (!) 102   Temp 97.8 F (36.6 C) (Oral)   Resp 19   Ht  (1.88 m)   Wt (!) 158.8 kg (350 lb)   SpO2 98%   BMI 44.94 kg/m   Physical Exam  Constitutional: He is oriented to person, place, and time. He  appears well-developed and well-nourished. No distress.  HENT:  Head:    Eyes: Pupils are equal, round, and reactive to light. EOM are normal.  Cardiovascular: Normal rate.  Pulmonary/Chest: Effort normal.  Neurological: He is alert and oriented to person, place, and time.  Skin: Skin is warm and dry.  Psychiatric: He has a normal mood and affect. His behavior is normal.  Nursing note and vitals reviewed.    ED Treatments / Results  Labs (all labs ordered are listed, but only abnormal results are displayed) Labs Reviewed - No data to display  EKG None  Radiology No results found.  Procedures Procedures (including critical care time) INCISION AND DRAINAGE Performed by: Gwyneth Sprout Consent: Verbal consent obtained. Risks and  benefits: risks, benefits and alternatives were discussed Type: abscess  Body area: scalp    Anesthesia: local infiltration  Incision was made with a scalpel.  Local anesthetic: lidocaine 2% with epinephrine  Anesthetic total: 2 ml  Complexity: complex Blunt dissection to break up loculations  Drainage: purulent  Drainage amount: 1mL  Packing material:none Patient tolerance: Patient tolerated the procedure well with no immediate complications.     Medications Ordered in ED Medications - No data to display   Initial Impression / Assessment and Plan / ED Course  I have reviewed the triage vital signs and the nursing notes.  Pertinent labs & imaging results that were available during my care of the patient were reviewed by me and considered in my medical decision making (see chart for details).    Pt with uncomplicated abscess.  I&D as above.  Final Clinical Impressions(s) / ED Diagnoses   Final diagnoses:  Abscess    ED Discharge Orders        Ordered    clindamycin (CLEOCIN) 150 MG capsule  Every 6 hours     05/13/17 0854       Gwyneth Sprout, MD 05/13/17 [phone]

## 2017-05-13 NOTE — ED Triage Notes (Signed)
Patient states that he has had multiple boils to the back of his head. He has another one and wants to have it looked at

## 2017-07-02 ENCOUNTER — Emergency Department (HOSPITAL_BASED_OUTPATIENT_CLINIC_OR_DEPARTMENT_OTHER)
Admission: EM | Admit: 2017-07-02 | Discharge: 2017-07-02 | Disposition: A | Discharge status: Home / Self Care | Payer: Self-pay | Attending: Emergency Medicine | Admitting: Emergency Medicine

## 2017-07-02 ENCOUNTER — Encounter (HOSPITAL_BASED_OUTPATIENT_CLINIC_OR_DEPARTMENT_OTHER): Payer: Self-pay | Admitting: Emergency Medicine

## 2017-07-02 ENCOUNTER — Other Ambulatory Visit: Payer: Self-pay

## 2017-07-02 ENCOUNTER — Emergency Department (HOSPITAL_BASED_OUTPATIENT_CLINIC_OR_DEPARTMENT_OTHER): Payer: Self-pay

## 2017-07-02 DIAGNOSIS — I10 Essential (primary) hypertension: Secondary | ICD-10-CM | POA: Insufficient documentation

## 2017-07-02 DIAGNOSIS — Z794 Long term (current) use of insulin: Secondary | ICD-10-CM | POA: Insufficient documentation

## 2017-07-02 DIAGNOSIS — R739 Hyperglycemia, unspecified: Secondary | ICD-10-CM

## 2017-07-02 DIAGNOSIS — Z79899 Other long term (current) drug therapy: Secondary | ICD-10-CM | POA: Insufficient documentation

## 2017-07-02 DIAGNOSIS — M5442 Lumbago with sciatica, left side: Secondary | ICD-10-CM

## 2017-07-02 DIAGNOSIS — E1165 Type 2 diabetes mellitus with hyperglycemia: Secondary | ICD-10-CM | POA: Insufficient documentation

## 2017-07-02 LAB — CBG MONITORING, ED: Glucose-Capillary: 325 mg/dL — ABNORMAL HIGH (ref 70–99)

## 2017-07-02 MED ORDER — CYCLOBENZAPRINE HCL 5 MG PO TABS
5.0000 mg | ORAL_TABLET | Freq: Once | ORAL | Status: AC
  Administered 2017-07-02: 5 mg via ORAL
  Filled 2017-07-02: qty 1

## 2017-07-02 MED ORDER — IBUPROFEN 800 MG PO TABS
800.0000 mg | ORAL_TABLET | Freq: Once | ORAL | Status: AC
  Administered 2017-07-02: 800 mg via ORAL
  Filled 2017-07-02: qty 1

## 2017-07-02 MED ORDER — CYCLOBENZAPRINE HCL 10 MG PO TABS: 10.0000 mg | ORAL_TABLET | Freq: Three times a day (TID) | ORAL | 0 refills | Status: AC | PRN

## 2017-07-02 MED ORDER — IBUPROFEN 800 MG PO TABS: 800.0000 mg | ORAL_TABLET | Freq: Three times a day (TID) | ORAL | 0 refills | Status: AC

## 2017-07-02 MED FILL — IBUPROFEN 800 MG TAB: 800 | 7 days supply | Qty: 21 | Fill #0

## 2017-07-02 MED FILL — CYCLOBENZAPRINE HCL 10 MG T: 10 | 5 days supply | Qty: 15 | Fill #0

## 2017-07-02 NOTE — Discharge Instructions (Signed)
Take motrin for pain.   Take flexeril for muscle spasms.   See ortho for follow up   Your blood sugar is 325. Please talk to your doctor about changing your insulin regimen   Return to ER if you have worse back pain, numbness, weakness, trouble walking, fever

## 2017-07-02 NOTE — ED Provider Notes (Signed)
[facility] EMERGENCY DEPARTMENT Provider Note   CSN: [id number] Arrival date & time: 07/02/17  0720     History   Chief Complaint Chief Complaint  Patient presents with  . Back Pain    HPI Curtis Rogers is a 32 y.o. male hx of DM, HTN, here presenting with back pain.  Patient states that he has constant crampy back pain for the last 3 days.  Patient states that it is worse with movement.  He states that he does have pain radiating down both legs but does not past the knee.  Denies any numbness or weakness or trouble walking or trouble with urination.  Patient denies any falls or injuries to his back.  She states that he has a friend who is her orthopedic doctor and he was told that he may have some muscle spasm but has not been started on any muscle relaxants yet.  Patient took some Bayer aspirin with minimal relief.  Denies any pain when he urinates or blood in the urine.  No previous history of back pain.  The history is provided by the patient.    Past Medical History:  Diagnosis Date  . Diabetes mellitus without complication (HCC)   . Hypertension     Patient Active Problem List   Diagnosis Date Noted  . Cellulitis of scalp 02/23/2017  . Hypokalemia 02/23/2017  . Essential hypertension 02/23/2017  . 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 Sepsis (HCC) 02/23/2017  . Hyponatremia 02/23/2017    History reviewed. No pertinent surgical history.      Home Medications    Prior to Admission medications   Medication Sig Start Date End Date Taking? Authorizing Provider  amLODipine (NORVASC) 10 MG tablet Take 10 mg by mouth at bedtime.     [provider]  clindamycin (CLEOCIN) 150 MG capsule Take 1 capsule (150 mg total) by mouth every 6 (six) hours. 05/13/17   Gwyneth Sprout, MD  Docusate Calcium (STOOL SOFTENER PO) Take 1 tablet by mouth 2 (two) times daily.    [provider]  doxycycline (VIBRAMYCIN) 100  MG capsule Take 1 capsule (100 mg total) by mouth 2 (two) times daily. 03/22/17   Cristina Gong, PA-C  insulin aspart protamine- aspart (NOVOLOG MIX 70/30) (70-30) 100 UNIT/ML injection Inject 0.3 mLs (30 Units total) into the skin 3 (three) times daily. 02/26/17   Albertine Grates, MD  insulin detemir (LEVEMIR) 100 UNIT/ML injection Inject 0.5 mLs (50 Units total) into the skin 2 (two) times daily. 02/26/17   Albertine Grates, MD  lisinopril (PRINIVIL,ZESTRIL) 40 MG tablet Take 40 mg by mouth daily.     [provider]  lovastatin (ALTOPREV) 20 MG 24 hr tablet Take 20 mg by mouth at bedtime.    [provider]    Family History Family History  Problem Relation Age of Onset  . Stroke Mother   . Diabetes Father   . Diabetes Other   . Breast cancer Other   . CAD Neg Hx     Social History Social History   Tobacco Use  . Smoking status: Never Smoker  . Smokeless tobacco: Never Used  Substance Use Topics  . Alcohol use: No  . Drug use: No     Allergies   Patient has no known allergies.   Review of Systems Review of Systems  Musculoskeletal: Positive for back pain.  All other systems reviewed and are negative.    Physical Exam Updated Vital  Signs BP (!) 152/104 (BP Location: Right Arm)   Pulse 88   Temp 97.6 F (36.4 C) (Oral)   Resp 20   Ht 6\' 3"  (1.905 m)   Wt (!) 163.3 kg (360 lb)   SpO2 100%   BMI 45.00 kg/m   Physical Exam  Constitutional: He is oriented to person, place, and time.  Uncomfortable, overweight   HENT:  Head: Normocephalic.  Mouth/Throat: Oropharynx is clear and moist.  Eyes: Pupils are equal, round, and reactive to light. EOM are normal.  Neck: Normal range of motion.  Cardiovascular: Normal rate, regular rhythm and normal heart sounds.  Pulmonary/Chest: Effort normal and breath sounds normal. No stridor. No respiratory distress.  Abdominal: Soft. Bowel sounds are normal. He exhibits no distension.  Musculoskeletal:  Mild lower back  tenderness, no CVAT, no obvious straight leg raise   Neurological: He is alert and oriented to person, place, and time.  No saddle anesthesia, nl gait, nl sensation and strength bilateral lower extremities   Skin: Skin is warm.  Psychiatric: His behavior is normal.  Nursing note and vitals reviewed.    ED Treatments / Results  Labs (all labs ordered are listed, but only abnormal results are displayed) Labs Reviewed  CBG MONITORING, ED    EKG None  Radiology No results found.  Procedures Procedures (including critical care time)  Medications Ordered in ED Medications  cyclobenzaprine (FLEXERIL) tablet 5 mg (has no administration in time range)  ibuprofen (ADVIL,MOTRIN) tablet 800 mg (has no administration in time range)     Initial Impression / Assessment and Plan / ED Course  I have reviewed the triage vital signs and the nursing notes.  Pertinent labs & imaging results that were available during my care of the patient were reviewed by me and considered in my medical decision making (see chart for details).     Curtis Rogers is a 32 y.o. male here with low back pain. Neurovascular intact, no trauma. Likely muscle spasms vs sciatica. Will get xrays. Will give motrin, flexeril. He is in the process of following with ortho outpatient.   8:27 AM CBG 325 similar to previous. xrays unremarkable. Pain improved with motrin, flexeril. He has a friend who is an orthopedic doctor and able to get appointment. Will dc home with motrin, flexeril   Final Clinical Impressions(s) / ED Diagnoses   Final diagnoses:  None    ED Discharge Orders    None       Charlynne PanderYao, Jamarea Selner Hsienta, MD 07/02/17 [phone]

## 2017-07-02 NOTE — ED Triage Notes (Signed)
Reports left flank, lower back pain x 3 days.  Denies dysuria, unsure of injury.  Reports pain worse when laying down or getting up.  Reports pain does radiate down left leg.

## 2019-05-26 IMAGING — CR DG LUMBAR SPINE COMPLETE 4+V
5 series · 5 of 5 positions shown · non-contrast
Comparison: None.

CLINICAL DATA: Lumbago for 3 days

EXAM:
LUMBAR SPINE - COMPLETE 4+ VIEW

[t l-spine a.p. *]
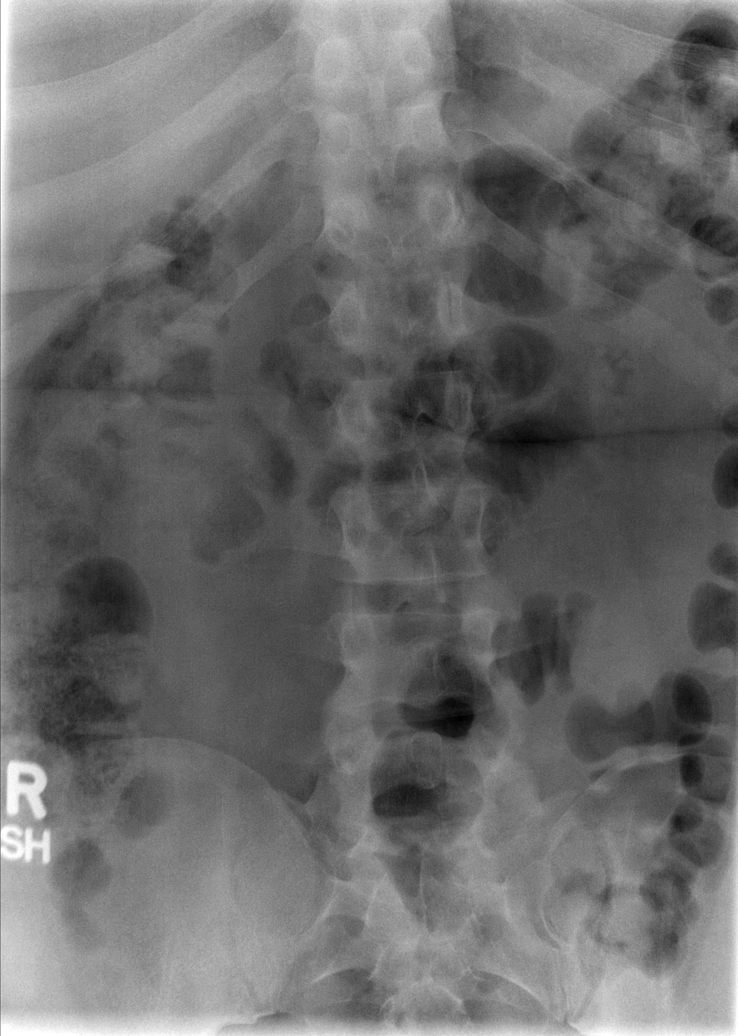

[t l-spine oblique exposure (1 of 2)]
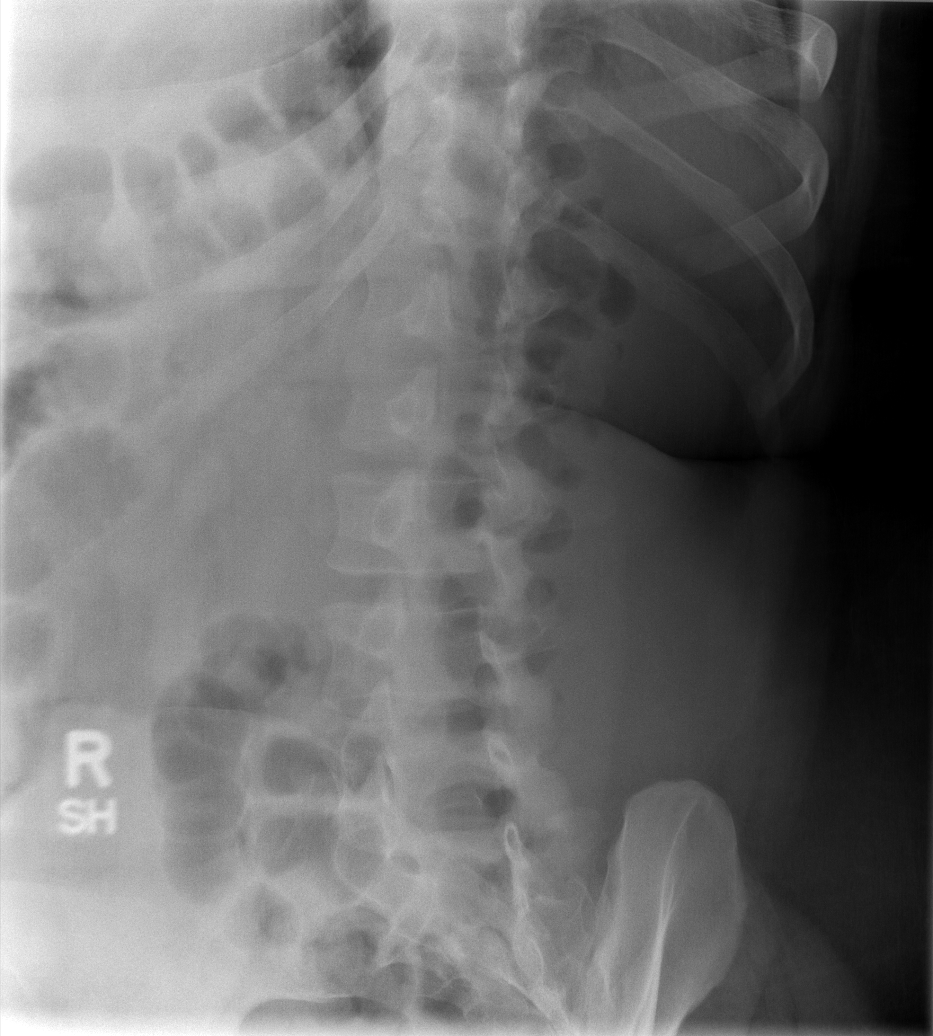

[t l-spine oblique exposure (2 of 2)]
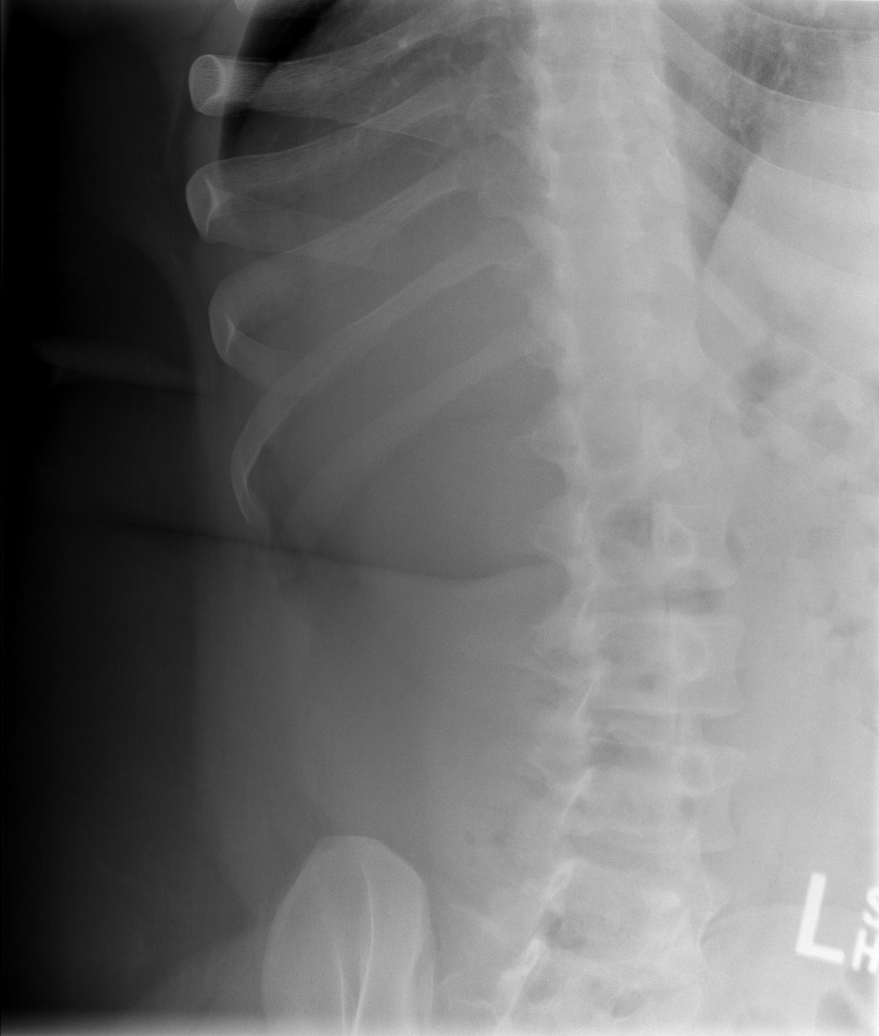

[t l-spine l5-s1 spot (1 of 2)]
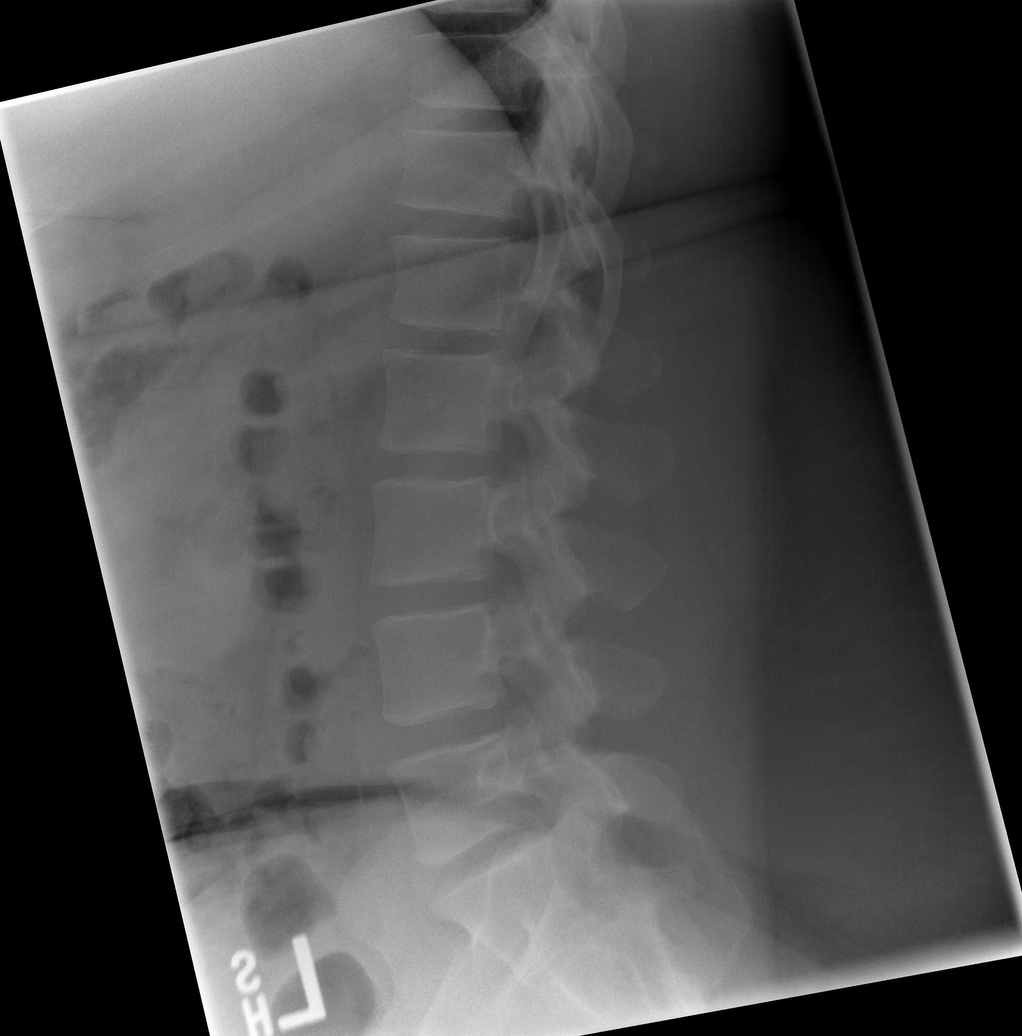

[t l-spine l5-s1 spot (2 of 2)]
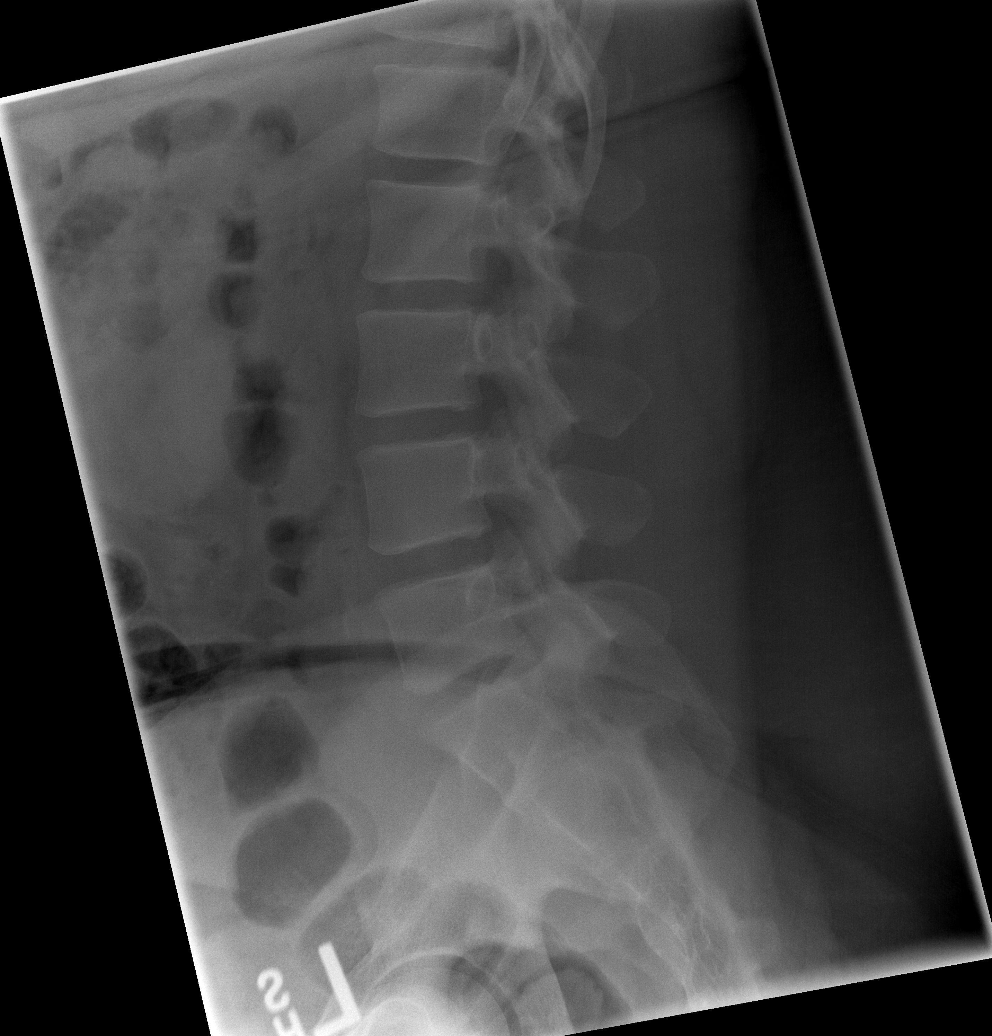

[5 of 5 positions shown; findings below may reference images not displayed]

FINDINGS: Frontal, lateral, spot lumbosacral lateral, and bilateral oblique
views were obtained. There are 5 non-rib-bearing lumbar type
vertebral bodies. No evident fracture or spondylolisthesis. Disc
spaces appear normal. There is no appreciable exit foraminal
narrowing on the oblique views.
IMPRESSION: No fracture or spondylolisthesis. No appreciable arthropathic
change.

## 2020-06-17 ENCOUNTER — Other Ambulatory Visit (HOSPITAL_BASED_OUTPATIENT_CLINIC_OR_DEPARTMENT_OTHER): Payer: Self-pay
